# Patient Record
Sex: Male | Born: 1940 | Race: Black or African American | Hispanic: No | Smoking: Former smoker
Health system: Southern US, Community
[De-identification: ages and names within clinical notes are randomized; demographics above are authoritative.]

## PROBLEM LIST (undated history)

## (undated) DIAGNOSIS — I499 Cardiac arrhythmia, unspecified: Secondary | ICD-10-CM

## (undated) DIAGNOSIS — I10 Essential (primary) hypertension: Secondary | ICD-10-CM

## (undated) DIAGNOSIS — E119 Type 2 diabetes mellitus without complications: Secondary | ICD-10-CM

## (undated) DIAGNOSIS — C61 Malignant neoplasm of prostate: Secondary | ICD-10-CM

## (undated) HISTORY — DX: Type 2 diabetes mellitus without complications: E11.9

---

## 2002-01-08 ENCOUNTER — Emergency Department (HOSPITAL_COMMUNITY): Admission: EM | Admit: 2002-01-08 | Discharge: 2002-01-08 | Payer: Self-pay | Admitting: Emergency Medicine

## 2002-02-06 ENCOUNTER — Encounter: Payer: Self-pay | Admitting: Internal Medicine

## 2002-02-06 ENCOUNTER — Emergency Department (HOSPITAL_COMMUNITY): Admission: EM | Admit: 2002-02-06 | Discharge: 2002-02-06 | Payer: Self-pay | Admitting: Internal Medicine

## 2005-07-05 ENCOUNTER — Emergency Department (HOSPITAL_COMMUNITY): Admission: EM | Admit: 2005-07-05 | Discharge: 2005-07-05 | Payer: Self-pay | Admitting: Emergency Medicine

## 2005-12-10 ENCOUNTER — Ambulatory Visit (HOSPITAL_COMMUNITY): Admission: RE | Admit: 2005-12-10 | Discharge: 2005-12-10 | Payer: Self-pay | Admitting: Family Medicine

## 2005-12-10 ENCOUNTER — Inpatient Hospital Stay (HOSPITAL_COMMUNITY): Admission: EM | Admit: 2005-12-10 | Discharge: 2005-12-20 | Payer: Self-pay | Admitting: Emergency Medicine

## 2005-12-10 ENCOUNTER — Ambulatory Visit: Payer: Self-pay | Admitting: Emergency Medicine

## 2005-12-16 ENCOUNTER — Encounter: Payer: Self-pay | Admitting: *Deleted

## 2005-12-28 ENCOUNTER — Emergency Department (HOSPITAL_COMMUNITY): Admission: EM | Admit: 2005-12-28 | Discharge: 2005-12-28 | Payer: Self-pay | Admitting: Emergency Medicine

## 2005-12-29 ENCOUNTER — Emergency Department (HOSPITAL_COMMUNITY): Admission: EM | Admit: 2005-12-29 | Discharge: 2005-12-29 | Payer: Self-pay | Admitting: Emergency Medicine

## 2007-11-10 ENCOUNTER — Encounter (INDEPENDENT_AMBULATORY_CARE_PROVIDER_SITE_OTHER): Payer: Self-pay | Admitting: Urology

## 2007-11-12 ENCOUNTER — Inpatient Hospital Stay (HOSPITAL_COMMUNITY): Admission: EM | Admit: 2007-11-12 | Discharge: 2007-11-17 | Payer: Self-pay | Admitting: Emergency Medicine

## 2007-11-25 ENCOUNTER — Encounter (HOSPITAL_COMMUNITY): Admission: RE | Admit: 2007-11-25 | Discharge: 2007-12-25 | Payer: Self-pay | Admitting: Urology

## 2007-12-02 ENCOUNTER — Emergency Department (HOSPITAL_COMMUNITY): Admission: EM | Admit: 2007-12-02 | Discharge: 2007-12-02 | Payer: Self-pay | Admitting: Emergency Medicine

## 2007-12-08 ENCOUNTER — Ambulatory Visit (HOSPITAL_COMMUNITY): Admission: RE | Admit: 2007-12-08 | Discharge: 2007-12-08 | Payer: Self-pay | Admitting: Urology

## 2007-12-11 ENCOUNTER — Emergency Department (HOSPITAL_COMMUNITY): Admission: EM | Admit: 2007-12-11 | Discharge: 2007-12-11 | Payer: Self-pay | Admitting: Emergency Medicine

## 2008-02-08 ENCOUNTER — Emergency Department (HOSPITAL_COMMUNITY): Admission: EM | Admit: 2008-02-08 | Discharge: 2008-02-08 | Payer: Self-pay | Admitting: Emergency Medicine

## 2008-02-22 ENCOUNTER — Ambulatory Visit: Admission: RE | Admit: 2008-02-22 | Discharge: 2008-04-05 | Payer: Self-pay | Admitting: Radiation Oncology

## 2008-04-08 DIAGNOSIS — C61 Malignant neoplasm of prostate: Secondary | ICD-10-CM

## 2008-04-08 HISTORY — DX: Malignant neoplasm of prostate: C61

## 2008-04-14 ENCOUNTER — Ambulatory Visit: Admission: RE | Admit: 2008-04-14 | Discharge: 2008-05-19 | Payer: Self-pay | Admitting: Radiation Oncology

## 2009-03-06 ENCOUNTER — Emergency Department (HOSPITAL_COMMUNITY): Admission: EM | Admit: 2009-03-06 | Discharge: 2009-03-06 | Payer: Self-pay | Admitting: Emergency Medicine

## 2009-03-14 ENCOUNTER — Ambulatory Visit (HOSPITAL_COMMUNITY): Admission: RE | Admit: 2009-03-14 | Discharge: 2009-03-14 | Payer: Self-pay | Admitting: Urology

## 2009-03-18 ENCOUNTER — Emergency Department (HOSPITAL_COMMUNITY): Admission: EM | Admit: 2009-03-18 | Discharge: 2009-03-18 | Payer: Self-pay | Admitting: Emergency Medicine

## 2009-04-09 ENCOUNTER — Emergency Department (HOSPITAL_COMMUNITY): Admission: EM | Admit: 2009-04-09 | Discharge: 2009-04-09 | Payer: Self-pay | Admitting: Emergency Medicine

## 2010-06-22 ENCOUNTER — Emergency Department (HOSPITAL_COMMUNITY)
Admission: EM | Admit: 2010-06-22 | Discharge: 2010-06-22 | Disposition: A | Discharge status: Home / Self Care | Payer: Medicare (Managed Care) | Attending: Emergency Medicine | Admitting: Emergency Medicine

## 2010-06-22 DIAGNOSIS — Y92009 Unspecified place in unspecified non-institutional (private) residence as the place of occurrence of the external cause: Secondary | ICD-10-CM | POA: Insufficient documentation

## 2010-06-22 DIAGNOSIS — IMO0002 Reserved for concepts with insufficient information to code with codable children: Secondary | ICD-10-CM | POA: Insufficient documentation

## 2010-06-22 DIAGNOSIS — T1500XA Foreign body in cornea, unspecified eye, initial encounter: Secondary | ICD-10-CM | POA: Insufficient documentation

## 2010-06-22 DIAGNOSIS — Y93H2 Activity, gardening and landscaping: Secondary | ICD-10-CM | POA: Insufficient documentation

## 2010-06-24 LAB — CULTURE, ROUTINE-ABSCESS

## 2010-07-10 LAB — URINALYSIS, ROUTINE W REFLEX MICROSCOPIC
Ketones, ur: NEGATIVE mg/dL
Leukocytes, UA: NEGATIVE
Nitrite: NEGATIVE
Protein, ur: 100 mg/dL — AB
Specific Gravity, Urine: >1.03 — ABNORMAL HIGH (ref 1.005–1.030)
pH: 6.5 (ref 5.0–8.0)

## 2010-07-10 LAB — URINE CULTURE

## 2010-07-10 LAB — URINE MICROSCOPIC-ADD ON

## 2010-07-11 LAB — URINALYSIS, ROUTINE W REFLEX MICROSCOPIC
Leukocytes, UA: NEGATIVE
Nitrite: NEGATIVE
Protein, ur: >300 mg/dL — AB
Urobilinogen, UA: 0.2 mg/dL (ref 0.0–1.0)

## 2010-07-11 LAB — URINE MICROSCOPIC-ADD ON

## 2010-07-11 LAB — URINE CULTURE
Colony Count: NO GROWTH
Culture: NO GROWTH

## 2010-08-21 NOTE — Group Therapy Note (Signed)
NAME:  WILDON, CUEVAS NO.:  [id number]   MEDICAL RECORD NO.:  [id number]          PATIENT TYPE:  INP   LOCATION:  A303                          FACILITY:  [facility]   PHYSICIAN:  Skeet Latch, DO    DATE OF BIRTH:  May 06, 1940   DATE OF PROCEDURE:  11/16/2007  DATE OF DISCHARGE:                                 PROGRESS NOTE   SUBJECTIVE:  Mr. Vora continues to improve.  Has no major complaints at  this time.  The patient was complaining of headaches, states that they  have improved.  Overall, he is doing well.   OBJECTIVE:  VITAL SIGNS:  Temperature 97.4, pulse 75, respirations 24,  blood pressure 126/82, saturating 96% on 2 liters.  CARDIOVASCULAR:  Regular rate and rhythm.  No murmurs, rubs or gallops.  LUNGS:  Clear to auscultation bilaterally.  No rhonchi, rales or  wheezes.  ABDOMEN:  Obese, soft, nontender, nondistended.  Positive bowel sounds.  EXTREMITIES:  No cyanosis, clubbing or edema.   LABORATORY DATA:  White count 20.5, hemoglobin 11.8, hematocrit 36.1,  platelet count 157.  Sodium 136, potassium 3.7, chloride 101, CO2 28,  glucose 115, BUN 14, creatinine 0.0.   ASSESSMENT/PLAN:  1. Urosepsis.  Positive blood cultures for E. coli.  Continue with IV      antibiotics.  Continues to be on vancomycin and Zosyn.  Will      attempt to adjust if the psychosis has not really improved.  2. History of nonischemic cardiomyopathy.  Will continue beta blocker,      Xanax and spironolactone.  3. Elevated PSA, probable prostate cancer.  This is being followed by      Urology.  4. Probable obstructive sleep apnea on prior sleep study as an      outpatient.  5. Hypertension.  Stable.  Being treated.      Skeet Latch, DO  Electronically Signed     SM/MEDQ  D:  11/16/2007  T:  11/16/2007  Job:  [id number]

## 2010-08-21 NOTE — Discharge Summary (Signed)
[name], [name] NO.:  [id number]   MEDICAL RECORD NO.:  [id number]          PATIENT TYPE:  INP   LOCATION:  A303                          FACILITY:  [facility]   PHYSICIAN:  Skeet Latch, DO    DATE OF BIRTH:  1940/12/22   DATE OF ADMISSION:  11/12/2007  DATE OF DISCHARGE:  08/11/2009LH                               DISCHARGE SUMMARY   DISCHARGE DIAGNOSES:  1. Urosepsis.  2. Nonischemic cardiomyopathy.  3. Elevated prostate-specific antigen, probable prostate cancer.  4. Obstructive sleep apnea.  5. Hypertension.  6. History of congestive heart failure.  7. History of chronic obstructive pulmonary disease.   BRIEF HOSPITAL COURSE:  This is a 70 year old African American male who  presented to the ER with fever and chills.  The patient had recent  prostatic biopsy and was taking p.o. antibiotics.  The patient presented  stating he has had fever and chills for a few days and now stated he was  started on antibiotics for secondary to recent prostate biopsy.  The  patient states that he is taking his medications a day prior to be  admitted.  He states he stopped taking it because it made him feel  nauseated.  Upon being seen in the emergency room, blood work showed a  white count of 23,400, hemoglobin 13.7, and hematocrit 40.8.  He had a  BUN of 15, creatinine 1.22.  His urine showed positive nitrites and  positive for bacteria.  The patient was admitted for urosepsis, pyrexia,  leukocytosis, hyponatremia, hypokalemia, and tachycardia.  The patient  was started on IV antibiotics with Zosyn and vancomycin and Urology was  consulted.  Secondary to his tachycardia, Cardiology was consulted.  The  patient has a history of nonischemic cardiomyopathy with estimated  ejection fraction of 30%.  They agree with antibiotic treatment for his  COPD.  His spironolactone and digoxin was continued and Coreg was  started to his regimen.  I recommended an outpatient  echocardiogram as  well as an outpatient sleep study.  They recommended followup with them  in 1-2 weeks after discharge, and he continue his current medications.  Urology was also consulted.  His IV vancomycin and Zosyn was continued.  The patient's symptoms of fever and chills have improved.  For his  urosepsis, the patient was continued on IV antibiotics during his  hospital stay and his white count has improved.  The patient's initial  chest x-ray showed acute cardiopulmonary disease and mild cardiomegaly  without failure.  He had a CT of his head without contrast was done  secondary to headaches, showed chronic microvascular change of the  hemispheric white matter.  No sign of acute or reversible pathology.  No  specific cause of headache was identified.  At this time, we felt the  patient could be discharged home.   MEDICATIONS AT DISCHARGE:  1. Lanoxin 0.25 mg daily.  2. Aldactone 25 mg daily.  3. Clonidine 0.1 mg twice a day.  4. Lisinopril 10 mg daily.  5. Coreg 3.125 mg twice a day.  6. Augmentin 875 mg twice  a day for 10 days.   CONDITION ON DISCHARGE:  Stable.   DISPOSITION:  The patient will be discharged to home.   VITALS ON DISCHARGE:  Temperature is 97.8, pulse 72, respiration is 20,  blood pressure 131/71, and he is sating 92% on room air.   LABORATORY DATA:  White count is 11,000, hemoglobin 11.8, hematocrit  35.9, and platelet count is 202.   DISCHARGE INSTRUCTIONS:  The patient is to maintain low-salt, heart-  healthy diet.  He is to increase his activity slowly.  The patient is to  follow up with Dr. Regino Schultze in approximately 1 week.  He is to follow up  with Dr. Domingo Sep on November 26, 2007, at 2.30 p.m. at her office.  He is  to return if he has any major pain and/or call his primary care  physician.      Skeet Latch, DO  Electronically Signed     SM/MEDQ  D:  11/17/2007  T:  11/18/2007  Job:  [id number]   cc:   Kirk Ruths, M.D.  Fax:  [phone]

## 2010-08-21 NOTE — Consult Note (Signed)
NAME:  KEIN, CARLBERG                 ACCOUNT NO.:  [id number]   MEDICAL RECORD NO.:  [id number]          PATIENT TYPE:  INP   LOCATION:  A303                          FACILITY:  [facility]   PHYSICIAN:  Ky Barban, M.D.DATE OF BIRTH:  04/08/1941   DATE OF CONSULTATION:  DATE OF DISCHARGE:                                 CONSULTATION   CHIEF COMPLAINT:  High fever and chills.  Mr. Oyama a couple of days ago  had transurethral needle biopsy of the prostate by Dr. Rito Ehrlich.  The  patient started to have chills and fever, came to the emergency room  where he was found to have almost 104 temperature, having difficulty  voiding, and he was given IV vancomycin and Zosyn.  In the emergency  room, the patient was already taking Cipro for __________ problem.  Now  his temperature has started to come down.  The patient is recommended  further management.  This morning, he was feeling much better having no  fever, no voiding difficulty.   PHYSICAL EXAMINATION:  Morbidly obese not in any acute distress, fully  conscious, alert, oriented.  Abdomen, soft, flat.  Liver, spleen, and  kidneys are not palpable.  External genitalia, he is circumcised.  Meatus adequate.  Testicles are normal.  Rectal examination is deferred.  Extremities are normal.   IMPRESSION:  1. Urosepsis probably.  2. Elevated prostate-specific antigen.  3. History of congestive heart failure.   RECOMMENDATIONS:  His Cipro has been discontinued, continue IV  vancomycin and Zosyn.  I will follow.      Ky Barban, M.D.  Electronically Signed     MIJ/MEDQ  D:  11/13/2007  T:  11/13/2007  Job:  [id number]

## 2010-08-21 NOTE — H&P (Signed)
NAME:  [name], [name] NO.:  [id number]   MEDICAL RECORD NO.:  [id number]          PATIENT TYPE:  INP   LOCATION:  A303                          FACILITY:  [facility]   PHYSICIAN:  Dorris Singh, DO    DATE OF BIRTH:  Jul 26, 1940   DATE OF ADMISSION:  11/12/2007  DATE OF DISCHARGE:  LH                              HISTORY & PHYSICAL   ADDENDUM:  Of note, there has been an error in his reported medications  from the emergency room.  He is currently on:  1. Lanoxin 0.25 mg 1 by mouth daily.  2. Cipro 500 mg 1 by mouth twice a day x2 weeks, which he has not been      taking.  3. Aldactone 25 mg 1 by mouth daily.  4. Clonidine 0.1 mg 1 by mouth twice a day.  5. Lisinopril 10 mg 1 by mouth daily.      Dorris Singh, DO  Electronically Signed     CB/MEDQ  D:  11/13/2007  T:  11/13/2007  Job:  [id number]

## 2010-08-21 NOTE — Consult Note (Signed)
[name], [name]                 ACCOUNT NO.:  [id number]   MEDICAL RECORD NO.:  [id number]          PATIENT TYPE:  INP   LOCATION:  A303                          FACILITY:  [facility]   PHYSICIAN:  Dani Gobble, MD       DATE OF BIRTH:  1940/09/07   DATE OF CONSULTATION:  11/13/2007  DATE OF DISCHARGE:                                 CONSULTATION   REFERRING PHYSICIAN:  Dr. Dorris Singh, [facility].   PRIMARY CARE PHYSICIAN:  Kirk Ruths, MD   The consult is for nonischemic cardiomyopathy.   [name] is a very pleasant 70 year old gentleman with a past medical  history of COPD, hypertension, nonischemic cardiomyopathy with an  ejection fraction estimated at 25-35%, a cath in 2007.  He has an  elevated PSA followed by Urology and he is status post recent prostate  biopsy.  He has hyperglycemia and hypertension.  He has been seen by  myself in September 2007, when he was admitted with chest discomfort.  He ultimately was transferred to [facility] for cardiac catheterization,  which revealed normal coronary arteries with a moderately severe global  hypokinesis with focal hypocontractility of the mid-to-basal inferior  wall.  EF was estimated at 30-35% at that point in time.  He had a  normal aortoiliac system and there was no evidence of  underlying  stenosis.  He was found to have an elevated PSA of 18 at that point in  time, and he was seen by Dr. Patsi Sears.  He has just recently had a  prostate biopsy 2 days prior to admission and was on antibiotics.  Review of the previous records show that he was actually admitted in  2007 for dyspnea on exertion and shortness of breath.  He had diminished  oxygen saturations at that point in time.  Bone scan by Dr. Patsi Sears  was negative.  He did have a pulmonary consult at that point in time,  which found that he had restrictive lung disease and he was placed on  bronchodilators.  They also recommend an outpatient sleep study to  evaluate for obstructive sleep apnea, but it is not clear to me whether  or not he had this performed.   He was admitted for fever and chills and body aches.  He was nauseated  on the antibiotics, so he self-discontinued them.  Apparently, per the  H&P, he was admitted with very high fever with chills and felt weak all  over and had myalgias and arthralgias.   On my interview with him, he denies chest pain, chest pressure, or chest  tightness.  He reports that he becomes somewhat short-winded when he  said as best if he is walking up along hill, but he reports his  pulmonary status has not changed over the past 2 years.  He denies  palpitations but does admit to occasional mild lightheadedness without  presyncope or syncope or falls.  He denies lower extremity edema, PND,  or orthopnea.  He did not return for his followup office visit due to  financial burden.  He now has  Medicare and appears quite eager to return  for followup visits.  Today, he reports feeling much better as compared  to yesterday on admission.  He certainly looks fairly upbeat sitting on  the side of the bed and without any distress whatsoever.   He had an echo in September 2007, which revealed mild concentric LVH;  mild aortic sclerosis without stenosis; mild MR, PI, and TR; and  diminished ejection fraction also estimated 25-35% with akinetic  inferior wall and otherwise moderate global hypokinesis.  He did have  left ventricular dilatation.  He had marked right atrial enlargement,  moderate left atrial enlargement, and moderate right ventricular  dilatation with some mild-to-moderate decrease in right ventricular  systolic function.  There was a trivial posterior pericardial effusion  without evidence of compromise.   PAST MEDICAL HISTORY:  1. Systolic CHF with the last ejection fraction of approximately 30%.  2. COPD.  3. Hypertension.  4. History of an elevated PSA, status post prostate biopsy 2 days       prior to admission.  5. History of hyperglycemia.  6. Hypertension.  7. Probable obstructive sleep apnea, but it is not clear to me if he      has had a sleep study or not.   ALLERGIES:  None known.   His medical regimen prior to admission according to the H&P include:  1. Lanoxin 0.25 mg daily.  2. Ciprofloxacin 500 b.i.d. for 2 weeks, however, he self discontinued      this.  3. Aldactone 25 mg daily.  4. Clonidine 0.1 mg of b.i.d.  5. Lisinopril 10 mg daily.   SOCIAL HISTORY:  He quit smoking 2 years ago.  He denies alcohol or  illicit drug use.  He lives alone.  He has one son and one daughter.   Family history is notable for CAD and hypertension.   REVIEW OF SYSTEMS:  GENERAL:  As outlined above.  EYES:  Negative.  ENT:  Negative.  RESPIRATORY:  As outlined above.  CARDIOVASCULAR:  As  outlined above.  GI:  Negative for hematochezia or melena.  GU:  As  outlined above with recent prostate biopsy and elevated PSA, managed by  Dr. Patsi Sears in Urology.  MUSCULOSKELETAL:  Negative.  NEUROLOGIC:  Negative.  SKIN:  Negative.  ENDOCRINE:  Negative for diabetes.   Physical exam reveals a very pleasant, obese African American male in no  acute distress.  Alert and oriented x3.  Weight 268 pounds.  He was  admitted with a fever of 104.6 and he was afebrile at last check.  There  are no reported blood pressures on the electronic medical records  despite the fact it is 9 a.m. in the morning.  His last blood pressure  yesterday at midnight was 109/59 with a pulse of 67, respirations 20,  and again afebrile at that point in time.  This morning, he was 96% O2  sat on 2 liters, however, after he walked it dropped to 89%.  HEENT,  normocephalic and atraumatic.  Neck is negative for any obvious JVD;  however, body habitus precludes excluding this definitively.  I do not  appreciate bruits, lymphadenopathy, or thyromegaly.  The lungs reveal  distant breath sounds throughout possibly  slightly more distant at the  bases, however, not significantly, and I do not appreciate crackles,  wheezes, or rhonchi.  Cardiovascular reveals a tachycardic rate, a  regular rhythm.  Distant S1 and S2.  He does have an S3.  I do  not  palpate the PMI, but I do not appreciate a heave or lift.  The abdomen  is obese, soft, and seemed somewhat distended to me; however, the  patient reports this is usual for him.  No obvious masses or bruits are  detected.  The lower extremities are entirely negative for edema.  Distal pulses are intact and equal bilaterally.   Lab work from today, an INR of 1.1, a brain natriuretic peptide is  negative 81.6, sodium 136, potassium 3.7, chloride 98, bicarb 33,  glucose 169, BUN 14, creatinine 1.1, and calcium of 8.6.  White count  18, hematocrit 41.2, and platelets 189,000.  He does have a left shift  to 95%.  UA on admission showed moderate bilirubin, large blood,  spillage of protein greater than 300, nitrate positive, and trace  leukocytes.  Urine microscopic notable for elevated white cells and a  few bacteria.  Lactate normal at 1.7.  His white count has actually come  down somewhat from yesterday when it was 23.4.  Blood cultures are  pending.  Chest x-ray on admission revealed mild cardiomegaly without  failure and no acute cardiopulmonary disease.   EKG this a.m. shows mild sinus tachycardia of 108 beats per minute with  late transition and nonspecific ST changes.  Other than the heart rate  being faster, there is no significant change from a previous EKG in  2007.   IMPRESSION:  1. Hospital admission for acute onset of fever, chills, myalgias, and      arthralgias status post prostate biopsy and self discontinuation of      antibiotics prematurely along with an increased white count and a      left shift, all suggestive of infectious process.  2. Physical exam, chest x-ray, and brain natriuretic peptide, all      negative for congestive heart  failure.  3. Nonischemic cardiomyopathy in 2007, with an estimated ejection      fraction of 30% as well as right ventricular failure as well.  4. Elevated prostate-specific antigen in the midst of a workup with      Urology.  5. Chronic obstructive pulmonary disease.  6. Hypertension - well controlled at present.  7. Probable obstructive sleep apnea although it is not clear to me if      he has had a sleep study or not.  8. History of hyperglycemia.  9. Normal coronary arteries and cath in 2007.  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   RECOMMENDATIONS:  1. Agree with current antibiotic treatment as well as appropriate      treatment for a COPD per the primary team.  2. Continue spironolactone.  3. Continue digoxin.  4. We would check a digoxin level.  5. Agree with ACE inhibitor and clonidine if necessary for his blood      pressure control.  6. He does need a beta-blocker, which I will start low-dose Coreg and      I will titrate as an outpatient.  7. Need outpatient echocardiogram, which I will pursue after he is      over this acute illness.  8. We will schedule a sleep study as an outpatient as well.  9. We would check a fasting lipid profile while he is in the hospital.  10.Low-dose aspirin unless contraindicated with his prostate issues.  11.We will schedule a followup appointment with me in 1-2 weeks after      discharge to pursue the above noted recommendations,  but  currently he has no active cardiac issues and is quite well  compensated.  He will need a repeat echo to assess LV function and RV  function.  Of note, he is able to walk a half a mile or 20 minutes daily  without symptomatology or  difficulty.  1. He does report that he now has Medicare and he would definitely      return for a scheduled outpatient visit for further followup           ______________________________  Dani Gobble, MD     AB/MEDQ  D:  11/13/2007  T:  11/13/2007   Job:  [id number]   cc:   Kirk Ruths, M.D.  Fax: [phone]

## 2010-08-21 NOTE — H&P (Signed)
NAME:  [name], [name] NO.:  [id number]   MEDICAL RECORD NO.:  [id number]          PATIENT TYPE:  INP   LOCATION:  A303                          FACILITY:  [facility]   PHYSICIAN:  Dorris Singh, DO    DATE OF BIRTH:  [birth year]   DATE OF ADMISSION:  11/12/2007  DATE OF DISCHARGE:  LH                              HISTORY & PHYSICAL   __________   emergency room with fever and chills.  The patient had a prostatic  biopsy about 2 days ago and was to be taking antibiotics.  He took one  day of it and it made him sick so he stopped this.   PRIMARY CARE PHYSICIAN:  Kirk Ruths, M.D.   UROLOGIST:  Dennie Maizes, M.D.   When he presented he had very high fever with chills.  He felt weak all  over.  He denies any chest pain, shortness of breath, abdominal pain, or  any type of nausea, vomiting, or diarrhea.   PAST MEDICAL HISTORY:  Significant for:  1. CHF.  His CHF has an ejection fraction of 25-35%.  2. COPD.  3. Hypertension.  4. History of elevated PSA.  5. History of hyperglycemia.   PAST SURGICAL HISTORY:  Significant for recent prostatic biopsy.  No  major surgeries.   ALLERGIES:  No known drug allergies.   SOCIAL HISTORY:  He is a former smoker.  He used to be a heavy smoker in  the past, 2 years ago.  He denies any drug use.  He is an occasional  drinker.   MEDICATIONS:  1. Lanoxin 0.125 mg daily.  2. Avapro 300 mg once a day.  3. Aldactone 25 mg once a day.  4. Clonidine 0.2 mg three times a day.  5. Diltiazem 300 mg once a day.  6. Aspirin 81 mg once a day.  7. Combivent inhaler 2 puffs 4 times a day which he is not taking.  8. Albuterol inhaler as needed which he ran out of.   REVIEW OF SYSTEMS:  CONSTITUTIONAL:  Positive for weakness, fever, and  chills.  EYES:  No changes in vision.  EARS/NOSE/MOUTH/THROAT:  Negative  for sore throat, changes in smell or hearing.  CARDIOVASCULAR:  Negative  for chest pain.  RESPIRATORY:  Negative for  cough.  GASTROINTESTINAL:  Negative for nausea, vomiting, diarrhea, abdominal pain, or blood in the  stool.  GENITOURINARY:  Positive for dysuria and frequency.  MUSCULOSKELETAL:  Negative for back pain and neck pain.  SKIN:  Negative  for pruritus and rash.  NEUROLOGY:  Negative for headache and numbness.   PHYSICAL EXAMINATION:  VITAL SIGNS:  His actual weight is 268 pounds,  temperature 104.6, respirations 24, pulse rate 165, with a blood  pressure of 167/104.  GENERAL:  The patient is a 70 year old male who is well-developed, well-  nourished.  HEENT:  Head is normocephalic and atraumatic.  Eyes are PERRLA, EOMI.  There is no conjunctival injection or scleral icterus.  Mouth and  oropharynx, there is no exudate noted.  NECK:  Supple.  Full range of motion.  No lymphadenopathy.  CARDIOVASCULAR:  Tachycardic.  No murmurs noted.  LUNGS:  Clear to auscultation bilaterally.  No rales, wheezes, or  rhonchi.  ABDOMEN:  Soft, nontender, and nondistended.  No suprapubic tenderness.  Also no CVA tenderness.  NEUROLOGY:  Cranial nerves II-XII grossly intact.  Normal speech.  SKIN:  Good turgor and good texture.   TESTING:  None was done.  There is a chest x-ray which shows no acute  cardiopulmonary disease, mild cardiomegaly without failure.   LABORATORY DATA:  White count 23.4, hemoglobin 13.7, hematocrit 40.8,  platelet count 204.  Sodium 134, potassium 3.4, chloride 97, carbon  dioxide 26, glucose 183, BUN 15, creatinine 1.22.  Lactic acid within  normal limits at 0.7.  Urine; 32 bacteria, positive nitrites.   ASSESSMENT:  1. Urosepsis.  2. Pyrexia.  3. Leukocytosis.  4. Hypokalemia.  5. Hyponatremia.  6. Tachycardia.   PLAN:  Admit the patient to service of [facility].  He was started on  Zosyn and Vancomycin and we will continue with those while he is here.  Also will consult Ky Barban, M.D. since he is status post a  prostatic biopsy 2 days prior.  We will put him on  nonsteroidal anti-  inflammatory agents to control his pyrexia.  We will also give him  fluids cautiously due to his CHF.  We will start him back on his  medication. We will also continue to monitor his rate and rhythm as well  as since he has been off of his Cardizem, but we will start him back on  it.  We may need __________.  We will continue to monitor and follow him  and change therapy as necessary.      Dorris Singh, DO  Electronically Signed     CB/MEDQ  D:  11/12/2007  T:  11/13/2007  Job:  [id number]

## 2010-08-24 NOTE — Cardiovascular Report (Signed)
NAME:  Christopher, Maddox NO.:  [id number]   MEDICAL RECORD NO.:  [id number]          PATIENT TYPE:  INP   LOCATION:  3736                         FACILITY:  [facility]   PHYSICIAN:  Nicki Guadalajara, M.D.     DATE OF BIRTH:  02-24-1941   DATE OF PROCEDURE:  12/13/2005  DATE OF DISCHARGE:                              CARDIAC CATHETERIZATION   INDICATIONS:  [name] is a 71 year old African-American male with  history of morbid obesity and shortness of breath.  He has history of  hypertension, COPD.  A strong family history of coronary disease, and  probable obstructive sleep apnea.  He was seen by Dr. Domingo Sep at [facility] with chest pain and dyspnea.  He ultimately was transferred to  [facility] with plans for ultimate cardiac catheterization.   DESCRIPTION OF PROCEDURE:  After premedication with Valium 5 mg  intravenously, the patient was prepped and draped in usual fashion.  His  right femoral artery was punctured anteriorly and a 5-French sheath was  inserted.  Diagnostic catheterization was done, with 5-French Judkins left  and right coronary catheters.  A 5-French pigtail catheter was used for  biplane left ventriculography.  Distal aortography was also performed, in  this patient was significant hypertensive history to evaluate the potential  for renal vascular etiology.  Hemostasis was obtained by direct manual  pressure.  He tolerated the procedure well.   HEMODYNAMIC DATA:  Central aortic pressure was 140/92, mean 112, left  ventricle pressure was 140/13, post A-wave 19.   ANGIOGRAPHIC DATA:  1. Left main coronary artery was angiographically normal and trifurcated      into an LAD, a ramus intermediate vessel, and a left circumflex vessel.      The left main was angiographically normal.  2. The LAD was angiographically normal and gave rise to a proximal      bifurcating diagonal vessel.  Several small septal perforating      arteries,  and it wrapped around the LV apex.  3. The ramus intermediate vessel was angiographically normal.  4. The circumflex vessel was angiographically normal.  It gave rise to      marginal vessels and a posterolateral vessel.  5. The right coronary artery was a normal vessel that gave rise to a PDA      and inferior LV branch.  It was angiographically normal.  6. Biplane left ventriculography revealed a dilated left ventricle with      moderately severe global hypocontractility with an ejection fraction of      approximately 30-35%.  There appeared to be more focal      hypocontractility involving the mid-to-basal inferior wall in the RAO      projection, as well as involving the mid-to-upper posterolateral wall      on the LAO projection.   Distal aortography revealed a fairly normal aortoiliac system.  Specifically  there is no evidence for renal artery stenosis.   IMPRESSION:  1. Moderately severe global LV dysfunction with an ejection fraction of 30-      35%  with more pronounced hypocontractility involving the mid-to-basal      inferior and mid-to-upper posterolateral wall.  2. Normal coronary arteries.  3. Normal aortoiliac system.  4. Systemic hypertension.  5. 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           ______________________________  Nicki Guadalajara, M.D.     TK/MEDQ  D:  12/13/2005  T:  12/13/2005  Job:  [id number]   cc:   Dani Gobble, MD  Kirk Ruths, M.D.

## 2010-08-24 NOTE — Procedures (Signed)
NAMECLIF, SERIO NO.:  [id number]   MEDICAL RECORD NO.:  [id number]          PATIENT TYPE:  INP   LOCATION:  A204                          FACILITY:  [facility]   PHYSICIAN:  Dani Gobble, MD       DATE OF BIRTH:  06-18-1940   DATE OF PROCEDURE:  DATE OF DISCHARGE:                                  ECHOCARDIOGRAM   REFERRING PHYSICIAN:  Kirk Ruths, M.D.   INDICATION:  A 70 year old gentleman admitted with shortness of breath and a  past medical history of hypertension who is referred for evaluation of LV  function.   The technical quality of the study is somewhat limited, particularly from  the apical views due to the patient's body habitus and poor acoustic  windows.   The aorta measures normally at 2.9-cm.  The left atrium is moderately  dilated, measured 5.0-cm.  The patient did appear to be in sinus rhythm  during the procedure.   The interventricular septum and posterior wall are mild to mildly thickened  measured at 1.5-cm and 1.4-cm, respectively.   The aortic valve is trileaflet and mildly thickened but with normal leaflet  excursion.  No significant aortic insufficiency is noted.  Doppler  interrogation of the aortic valve is within normal limits.   The mitral valve appears structurally normal.  No mitral valve prolapse is  noted.  Mild mitral regurgitation is noted.  Doppler interrogation of the  mitral valve is within normal limits.   The pulmonic valve is not well visualized but appeared to be grossly  structurally normal with trace to mild pulmonic insufficiency.   The tricuspid valve appears grossly structurally normal.  Mild tricuspid  regurgitation is noted.   The left ventricle is borderline dilated with the LV IDD measured at 5.4-cm.  The LV ISD is measured 4.3-cm.  Overall left ventricular systolic function  is diminished with an estimated ejection fraction of 25-35%.  There is  moderate global hypokinesis except the inferior  wall appears akinetic.  Doppler of the inflow reveals a pseudo normal pattern.  There is an  increased E point septal separation consistent with left ventricular  dilatation.   The right atrium is moderate to markedly dilated.  The right ventricle is  moderately dilated with a mild to moderate decrease in right ventricular  ejection fraction.   There is a suggestion of a trivial posterior pericardial effusion but this  is not well visualized and there no evidence of hemodynamic compromise.   IMPRESSION:  1. Mild to moderate concentric left ventricular hypertrophy.  2. Moderate left atrial enlargement.  3. Mild aortic sclerosis without stenosis.  4. Mild mitral and tricuspid regurgitation.  5. Trace to mild pulmonic insufficiency.  6. Borderline left ventricular dilatation with diminished ejection      fraction estimated at 25-35%.  There is moderate global hypokinesis      except the inferior wall is akinetic.  There is a pseudo normal pattern      on the inflow signal.  7. Moderate to marked right atrial enlargement.  8. Moderate right  ventricular enlargement with moderate decrease in right      ventricular ejection fraction           ______________________________  Dani Gobble, MD     AB/MEDQ  D:  12/11/2005  T:  12/11/2005  Job:  [id number]   cc:   Kirk Ruths, M.D.  Fax: [phone]

## 2010-08-24 NOTE — Consult Note (Signed)
NAME:  [name], [name]                 ACCOUNT NO.:  [id number]   MEDICAL RECORD NO.:  [id number]          PATIENT TYPE:  INP   LOCATION:  3736                         FACILITY:  [facility]   PHYSICIAN:  Sigmund I. Patsi Sears, M.D.DATE OF BIRTH:  12-Aug-1940   DATE OF CONSULTATION:  12/13/2005  DATE OF DISCHARGE:                                   CONSULTATION   SUBJECTIVE:  This 70 year old divorced, retired [place] American male, who  just returned one week prior to his admission to [facility] from  [employer].  He is admitted to [facility] and then to  [facility] for evaluation of shortness of breath, where a  laboratory evaluation showed elevation of PSA to 18.  The patient denies  bladder outlet symptoms, but does note that he had gross hematuria one and a  half years ago x1 episode.  Because it cleared, he never did anything about  it.  He has no flank pain, no fever, no chills.  No history of kidney  stones.  There is no family history of stones or tumors.   REVIEW OF SYSTEMS:  Is negative, except for shortness of breath and  wheezing.  The patient denies difficulty voiding and has not had a catheter  this week.   PAST HISTORY:  1. COPD.  2. Hypertension.  3. Coronary artery disease.   SOCIAL HISTORY:  Tobacco history of 2 pack a day x47 years (now no tobacco  and having patch in place).  Alcohol history of one pint per week x40 years,  now with decreased amount of alcohol.   PHYSICAL EXAMINATION:  GENERAL:  Today shows an obese African American male  in no acute distress.  VITAL SIGNS:  His vital signs show a temperature of 98.4.  Blood pressure  118/84.  Pulse 81.  Respiratory rate 20.  O2 saturation 96%.  NECK:  Supple.  Nontender.  No nodes.  CHEST:  Occasional wheezes, COPD with distant breath sounds.  CORE:  S1, S2 normal without heaves, thrills or murmurs.  ABDOMEN:  Obese.  Positive bowel sounds without organomegaly or masses.  GENITOURINARY:  Shows normal penis.  Normal urethra.  Normal glands.  Testicles measure 4 x 4 cm.  They are nontender.  RECTAL EXAMINATION:  Shows normal sphincter tone.  Prostate 3+.  __________  benign.  No masses and no blood.  EXTREMITIES:  Show no cyanosis or edema.  PSYCHOLOGIC:  Shows normal orientation to time, person and place.   IMPRESSION:  Routine laboratory screening PSA of 18 could indicate prostate  cancer with metastatic disease, or could be spurious result.   PLAN:  We will recheck PSA.  If elevated, then patient will need bone scan  while in hospital.  Eventually will need to have prostate biopsy (as  outpatient).      Sigmund I. Patsi Sears, M.D.  Electronically Signed     SIT/MEDQ  D:  12/13/2005  T:  12/14/2005  Job:  [id number]   cc:   Gerlene Burdock A. Alanda Amass, M.D.

## 2010-08-24 NOTE — H&P (Signed)
[name], ROSENDAHL                 ACCOUNT NO.:  [id number]   MEDICAL RECORD NO.:  [id number]          PATIENT TYPE:  INP   LOCATION:  A204                          FACILITY:  [facility]   PHYSICIAN:  Kirk Ruths, M.D.DATE OF BIRTH:  1940-05-21   DATE OF ADMISSION:  12/10/2005  DATE OF DISCHARGE:  LH                                HISTORY & PHYSICAL   CHIEF COMPLAINT:  Shortness of breath.   HISTORY OF PRESENT ILLNESS:  This is a 70 year old gentleman who is morbidly  obese.  The patient was seen in the office originally today with shortness  of breath progressive over the last several months.  The patient had O2  saturations in the 80s before and after nebulizers in the ER.  The patient  is a heavy smoker and he has hypertension for which he stopped taking his  Cardizem years ago.  The patient's chest film showed some cardiomegaly with  atelectasis.  Labs were significant for an elevated BNP at 500.   ALLERGIES:  No known drug allergies.   MEDICATIONS:  None.   PAST MEDICAL HISTORY:  History of hypertension in the past, but has been  noncompliant for years.   SOCIAL HISTORY:  The patient is a heavy smoker, although he states he is  trying to quit.  He does drink alcohol occasionally.  He has a strong family  history for heart disease.   REVIEW OF SYSTEMS:  Denies chest pains, orthopnea or edema, gastroesophageal  reflux disease or GI complaints.   PHYSICAL EXAMINATION:  GENERAL:  He is a morbidly obese, pleasant male in no  severe distress.  VITAL SIGNS:  Afebrile, blood pressure 180/120, pulse 90 and regular,  respirations 24 and unlabored.  HEENT:  TMs normal.  Pupils equal round and reactive to light and  accommodation.  Oropharynx benign.  NECK:  Supple without JVD, bruit or thyromegaly.  LUNGS:  Essentially clear, although he does have prolonged expirations.  HEART:  Regular sinus rhythm without murmurs, rubs or gallops.  ABDOMEN:  Pendulous, soft, nontender.  EXTREMITIES:  Trace edema.  NEUROLOGIC:  Grossly intact.   ASSESSMENT:  1. Shortness of breath.  2. Probable chronic obstructive pulmonary disease.  3. History of hypertension.  4. History of cigarette use.      Kirk Ruths, M.D.  Electronically Signed     WMM/MEDQ  D:  12/10/2005  T:  12/10/2005  Job:  [id number]

## 2010-08-24 NOTE — Discharge Summary (Signed)
NAME:  [name], [name] NO.:  [id number]   MEDICAL RECORD NO.:  [id number]          PATIENT TYPE:  INP   LOCATION:  3736                         FACILITY:  [facility]   PHYSICIAN:  Darlin Priestly, MD  DATE OF BIRTH:  1940-05-13   DATE OF ADMISSION:  12/12/2005  DATE OF DISCHARGE:  12/20/2005                                 DISCHARGE SUMMARY   Mr.  Lightcap is a 70 year old male patient who was initially seen at [facility] by Dr. Ermalinda Memos on December 11, 2005.  He had been admitted  for dyspnea on exertion and shortness of breath.  He has been a heavy  smoker.  He has had hypertension but not been on his medications for some  time.  His O2 sats in the emergency room were 80%.  He was complaining about  chest pressure also, with dyspnea on exertion x1 month, was only able to  walk a couple of blocks at a time without having to stop to rest.  Dr.  Ermalinda Memos felt that he would need further cardiac evaluation, thus he was  transferred to [facility]  He was noted to have CHF.  He had lower  extremity edema.  His BNP was elevated.  It was decided that he should  undergo cardiac catheterization.  He was also put on Lovenox.  His cath was  done on December 15, 2005.  EF was 30%.  He had no coronary disease and he  had no renal artery stenosis.  He had global hypokinesis.   He also had a PSA done; it was very elevated.  Urology consult was called.  They decided to recheck the prostate level and if it was still elevated, to  get a bone scan.  As the level was so elevated, thus the bone scan was done.  It was negative.  They stated that he would eventually need an outpatient  biopsy.  The consult was done by Dr. Patsi Sears.  We continued to add  medications to try to control his blood pressure.  A pulmonary consult was  called.  He had PFTs done.  He has restrictive lung disease and he was put  on bronchodilators.  Pulmonary thought he would need an outpatient sleep  study to evaluate for obstructive sleep apnea.  He continued to have low O2  sats when he was up in our halls walking.  His blood pressure eventually  came down.  On December 19, 2005, it was 128/80.   He was seen by case management to help with financial problems and insurance  issues.  The patient stated he wanted to follow up with pulmonary in  [place] because of financial cost.  At the time of discharge, he was  changed to Combivent and albuterol.  He was also put on all generic  medications except for his ARB which there is no generic.  On December 20, 2005, he was considered stable for discharge home.  His blood pressure was  116/80.  Heart rate was 76.  Respirations were 18.  Temperature was 97.1.  LABORATORIES:  Sodium was 128, potassium 4.  Chloride was 91.  CO2 was 38.  Glucose was 138.  BUN was 23.  Creatinine was 1.  Hemoglobin was 15.9,  hematocrit 48.8.  WBCs were 12.7.  MCV was 94.9.  Platelets were 189.  BNP  was 587 at [facility], and then on December 16, 2005, it was 299.  PSA  initially was 17.69; the following day it was 16.56.  His urine was negative  for any infection.  He had a culture done. He had 20,000 colony counts of  multiple bacteria.  A chest x-ray showed stable cardiomegaly and small  effusions on December 12, 2005.  On December 16, 2005, he had a CT of his  abdomen with contrast.  He had no acute abdominal findings.  No abdominal  mass, lesions, or adenopathy.  He had bilateral pleural effusions.  No  significant bony abnormality, and no significant pulmonary, pelvic findings.  No masses.  Whole body scan was done on December 16, 2005.  He had no  definite evidence of metastatic disease.   DISCHARGE DIAGNOSES:  1. Hypertensive heart disease with global hypokinesis.  2. Non ischemic cardiomyopathy with an ejection fraction of 30% by      catheterization.  3. Chest pain, non ischemic related, status post cardiac catheterization      with normal  coronary arteries and also had normal renal arteries.  4. 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  Had      pulmonary function tests while in the hospital.  FVC was 37% predicted,      FEV1 was 35% predicted.  FE1/FVC actual was 66%; predicted was 78%.      His DLCO2 was 50%.  DL/VA was 161. The patient preferred to follow up      with a pulmonary physician in [place].  5. Elevated PSA.  Needs followup as an outpatient.  He will need      outpatient prostate biopsy.  He will follow up with Dr. Patsi Sears.  He      had a negative bone scan while in the hospital.  6. Questionable obstructive sleep apnea.  Will need outpatient sleep      study.  7. Hypertension, controlled on multiple medications.  8. Financial problems.  Currently not on Medicare.  He has retired.  His      social security and retirement cover his living expenses and he has      only a very small amount of money extra.  9. Four-chamber enlargement on echocardiogram with decreased right      ventricular and left ventricular function.      Lezlie Octave, N.P.      Darlin Priestly, MD  Electronically Signed    BB/MEDQ  D:  12/20/2005  T:  12/21/2005  Job:  [id number]   cc:   Pulmonary service  Dr. Timoteo Expose in [place]  Dr. Patsi Sears  Dr. Cline Cools, [place]

## 2010-08-24 NOTE — Discharge Summary (Signed)
[name], [name] NO.:  [id number]   MEDICAL RECORD NO.:  [id number]          PATIENT TYPE:  INP   LOCATION:  3736                         FACILITY:  [facility]   PHYSICIAN:  Kirk Ruths, M.D.DATE OF BIRTH:  1941-02-02   DATE OF ADMISSION:  12/10/2005  DATE OF DISCHARGE:  09/14/2007LH                                 DISCHARGE SUMMARY   FINAL DIAGNOSES:  1. Chronic obstructive pulmonary disease.  2. Hypertension.  3. Congestive heart failure.  4. Cardiomyopathy with ejection fraction of 25-35%.  5. Elevated prostate-specific antigen.  6. Hyperglycemia.   HOSPITAL COURSE:  This is a 70 year old male who was admitted through the  office with severe hypertension along with shortness of breath and O2  saturations in the 80s.  The patient had been noncompliant with his Cardizem  which was restarted.  The patient's blood pressure returned towards normal.  Initial white count was 6700 with hemoglobin of 14.9.  The patient's  electrolytes were within normal range.  He did have some elevated blood  sugars 203 on admission and 158 the following day.  Cardiac enzymes were  negative except persistently elevated troponin which would be consistent  with his elevated BNP of 500.  The patient's thyroid studies were normal.  He had a PSA elevated at 18.86.  The patient was seen by cardiology, Dr.  Domingo Sep, and after abnormal echocardiogram and CT scan suggest CHF the  patient was transferred to [facility] for cardiac catheterization and  further evaluation of his multiple medical problems.  Incidentally, his  lipids were within the normal range with total cholesterol being 171,  triglycerides 40, HDL 42, LDL 121.      Kirk Ruths, M.D.  Electronically Signed     WMM/MEDQ  D:  01/10/2006  T:  01/11/2006  Job:  [id number]

## 2011-01-04 LAB — DIFFERENTIAL
Basophils Absolute: 0
Basophils Relative: 0
Basophils Relative: 0
Basophils Relative: 0
Eosinophils Absolute: 0
Eosinophils Absolute: 0.1
Eosinophils Absolute: 0.2
Eosinophils Relative: 0
Eosinophils Relative: 0
Eosinophils Relative: 0
Lymphocytes Relative: 12
Lymphocytes Relative: 7 — ABNORMAL LOW
Lymphs Abs: 0.4 — ABNORMAL LOW
Lymphs Abs: 1.4
Lymphs Abs: 1.8
Monocytes Absolute: 0.5
Monocytes Absolute: 0.5
Monocytes Absolute: 1
Monocytes Absolute: 1.6 — ABNORMAL HIGH
Monocytes Relative: 10
Monocytes Relative: 11
Monocytes Relative: 3
Monocytes Relative: 9
Monocytes Relative: 9
Neutro Abs: 15.5 — ABNORMAL HIGH
Neutrophils Relative %: 74
Neutrophils Relative %: 77
Neutrophils Relative %: 95 — ABNORMAL HIGH
Neutrophils Relative %: 95 — ABNORMAL HIGH

## 2011-01-04 LAB — CBC
HCT: 36.5 — ABNORMAL LOW
HCT: 41.2
Hemoglobin: 11.8 — ABNORMAL LOW
Hemoglobin: 11.8 — ABNORMAL LOW
Hemoglobin: 12.1 — ABNORMAL LOW
Hemoglobin: 13.6
MCHC: 33
MCHC: 33
MCHC: 33.1
MCV: 89.1
MCV: 89.5
MCV: 90.2
MCV: 90.4
Platelets: 176
RBC: 3.97 — ABNORMAL LOW
RBC: 4 — ABNORMAL LOW
RBC: 4.03 — ABNORMAL LOW
RBC: 4.57
RBC: 4.58
RDW: 13.4
RDW: 13.5
WBC: 11.4 — ABNORMAL HIGH
WBC: 18 — ABNORMAL HIGH
WBC: 20.5 — ABNORMAL HIGH
WBC: 23.4 — ABNORMAL HIGH

## 2011-01-04 LAB — LIPID PANEL
LDL Cholesterol: 87
Total CHOL/HDL Ratio: 6.3
Triglycerides: 93
VLDL: 19

## 2011-01-04 LAB — BASIC METABOLIC PANEL
BUN: 16
CO2: 26
CO2: 26
CO2: 33 — ABNORMAL HIGH
Calcium: 8.5
Calcium: 8.5
Calcium: 9.1
Chloride: 101
Chloride: 97
Chloride: 98
Creatinine, Ser: 0.9
Creatinine, Ser: 1.04
Creatinine, Ser: 1.11
GFR calc Af Amer: 54 — ABNORMAL LOW
GFR calc Af Amer: >60
GFR calc Af Amer: >60
GFR calc non Af Amer: 45 — ABNORMAL LOW
GFR calc non Af Amer: >60
Glucose, Bld: 143 — ABNORMAL HIGH
Glucose, Bld: 169 — ABNORMAL HIGH
Potassium: 3.7
Potassium: 3.7
Potassium: 4.3
Sodium: 133 — ABNORMAL LOW
Sodium: 134 — ABNORMAL LOW
Sodium: 136
Sodium: 136

## 2011-01-04 LAB — URINE CULTURE

## 2011-01-04 LAB — VANCOMYCIN, TROUGH: Vancomycin Tr: 10

## 2011-01-04 LAB — URINE MICROSCOPIC-ADD ON

## 2011-01-04 LAB — URINALYSIS, ROUTINE W REFLEX MICROSCOPIC
Nitrite: POSITIVE — AB
Specific Gravity, Urine: >1.03 — ABNORMAL HIGH
Urobilinogen, UA: 1

## 2011-01-04 LAB — CULTURE, BLOOD (ROUTINE X 2)

## 2011-01-04 LAB — LACTIC ACID, PLASMA: Lactic Acid, Venous: 1.7

## 2011-01-04 LAB — DIGOXIN LEVEL: Digoxin Level: <0.2 — ABNORMAL LOW

## 2011-01-04 LAB — PROTIME-INR: INR: 1.1

## 2011-01-08 LAB — URINE CULTURE

## 2011-01-08 LAB — URINALYSIS, ROUTINE W REFLEX MICROSCOPIC
Bilirubin Urine: NEGATIVE
Protein, ur: NEGATIVE
Urobilinogen, UA: 0.2

## 2011-01-09 LAB — URINALYSIS, ROUTINE W REFLEX MICROSCOPIC
Bilirubin Urine: NEGATIVE
Nitrite: POSITIVE — AB
Urobilinogen, UA: 1
pH: 7

## 2011-01-09 LAB — BASIC METABOLIC PANEL
Chloride: 101
GFR calc non Af Amer: >60
Glucose, Bld: 129 — ABNORMAL HIGH
Potassium: 4.3
Sodium: 139

## 2011-01-09 LAB — CBC
HCT: 40.1
Hemoglobin: 13.4
MCV: 90.1
RDW: 13.8
WBC: 11.7 — ABNORMAL HIGH

## 2011-01-09 LAB — URINE MICROSCOPIC-ADD ON

## 2011-01-09 LAB — DIFFERENTIAL
Eosinophils Absolute: 0.3
Eosinophils Relative: 2
Lymphocytes Relative: 21
Lymphs Abs: 2.4
Monocytes Absolute: 0.1

## 2011-01-09 LAB — APTT: aPTT: 31

## 2011-01-09 LAB — URINE CULTURE

## 2011-06-11 ENCOUNTER — Emergency Department (HOSPITAL_COMMUNITY)
Admission: EM | Admit: 2011-06-11 | Discharge: 2011-06-11 | Disposition: A | Discharge status: Home / Self Care | Payer: Medicare HMO | Attending: Emergency Medicine | Admitting: Emergency Medicine

## 2011-06-11 ENCOUNTER — Encounter (HOSPITAL_COMMUNITY): Payer: Self-pay | Admitting: *Deleted

## 2011-06-11 DIAGNOSIS — Z87891 Personal history of nicotine dependence: Secondary | ICD-10-CM | POA: Insufficient documentation

## 2011-06-11 DIAGNOSIS — C61 Malignant neoplasm of prostate: Secondary | ICD-10-CM | POA: Insufficient documentation

## 2011-06-11 DIAGNOSIS — R350 Frequency of micturition: Secondary | ICD-10-CM | POA: Insufficient documentation

## 2011-06-11 DIAGNOSIS — I10 Essential (primary) hypertension: Secondary | ICD-10-CM | POA: Insufficient documentation

## 2011-06-11 HISTORY — DX: Malignant neoplasm of prostate: C61

## 2011-06-11 HISTORY — DX: Essential (primary) hypertension: I10

## 2011-06-11 LAB — URINALYSIS, ROUTINE W REFLEX MICROSCOPIC
Glucose, UA: NEGATIVE mg/dL
Leukocytes, UA: NEGATIVE
Nitrite: NEGATIVE
Specific Gravity, Urine: 1.02 (ref 1.005–1.030)
pH: 6 (ref 5.0–8.0)

## 2011-06-11 LAB — COMPREHENSIVE METABOLIC PANEL
ALT: 22 U/L (ref 0–53)
AST: 17 U/L (ref 0–37)
Albumin: 3.3 g/dL — ABNORMAL LOW (ref 3.5–5.2)
Alkaline Phosphatase: 104 U/L (ref 39–117)
Chloride: 100 mEq/L (ref 96–112)
Potassium: 4.7 mEq/L (ref 3.5–5.1)
Sodium: 139 mEq/L (ref 135–145)
Total Bilirubin: 0.2 mg/dL — ABNORMAL LOW (ref 0.3–1.2)

## 2011-06-11 LAB — CBC
MCH: 29.8 pg (ref 26.0–34.0)
MCHC: 33.5 g/dL (ref 30.0–36.0)
MCV: 88.9 fL (ref 78.0–100.0)
Platelets: 286 10*3/uL (ref 150–400)
RBC: 4.4 MIL/uL (ref 4.22–5.81)

## 2011-06-11 LAB — DIFFERENTIAL
Eosinophils Absolute: 0.4 10*3/uL (ref 0.0–0.7)
Eosinophils Relative: 4 % (ref 0–5)
Lymphs Abs: 3.3 10*3/uL (ref 0.7–4.0)

## 2011-06-11 NOTE — ED Provider Notes (Signed)
History  This chart was scribed for Benny Lennert, MD by Bennett Scrape. This patient was seen in room APA10/APA10 and the patient's care was started at 3:59PM.  CSN: [id number]  Arrival date & time 06/11/11  1331   First MD Initiated Contact with Patient 06/11/11 1556      Chief Complaint  Patient presents with  . Urinary Frequency   Patient is a 71 y.o. male presenting with frequency. The history is provided by the patient. No language interpreter was used.  Urinary Frequency This is a new problem. The current episode started more than 2 days ago. The problem occurs constantly. The problem has been gradually worsening. Pertinent negatives include no chest pain, no abdominal pain, no headaches and no shortness of breath. The symptoms are aggravated by nothing. The symptoms are relieved by nothing. He has tried nothing for the symptoms.     [name] is a 71 y.o. male who presents to the Emergency Department complaining of 3 to 4 days of gradual onset, gradually worsening, constant urinary frequency. Pt describes his urinary frequency symptoms as feeling the need to urinate more than usual with some occasional difficulty emptying his bladder. He denies any modifying factors. He denies trying any at home treatments to improve his symptoms. He reports that he has been staying hydrated and drinking plenty of fluids. He denies dysuria and fever as associated symptoms. He denies having a h/o diabetes. He denies having any other illnesses or injuries at this time. He has a h/o HTN and prostate cancer of which he is currently getting chemotherapy for. He is a former smoker but denies alcohol use.  Past Medical History  Diagnosis Date  . Hypertension   . Prostate cancer 2011    History reviewed. No pertinent past surgical history.  History reviewed. No pertinent family history.  History  Substance Use Topics  . Smoking status: Former Games developer  . Smokeless tobacco: Not on file  .  Alcohol Use: No      Review of Systems  Constitutional: Negative for fatigue.  HENT: Negative for congestion, sinus pressure and ear discharge.   Eyes: Negative for discharge.  Respiratory: Negative for cough and shortness of breath.   Cardiovascular: Negative for chest pain.  Gastrointestinal: Negative for abdominal pain and diarrhea.  Genitourinary: Positive for frequency and difficulty urinating. Negative for dysuria and hematuria.  Musculoskeletal: Negative for back pain.  Skin: Negative for rash.  Neurological: Negative for seizures and headaches.  Hematological: Negative.   Psychiatric/Behavioral: Negative for hallucinations.    Allergies  Review of patient's allergies indicates no known allergies.  Home Medications  No current outpatient prescriptions on file.  Triage Vitals: BP 120/56  Pulse 87  Temp(Src) 98.6 F (37 C) (Oral)  Resp 18  Ht 5\' 11"  (1.803 m)  Wt 240 lb (108.863 kg)  BMI 33.47 kg/m2  SpO2 98%  Physical Exam  Nursing note and vitals reviewed. Constitutional: He is oriented to person, place, and time. He appears well-developed and well-nourished.  HENT:  Head: Normocephalic and atraumatic.  Eyes: Conjunctivae and EOM are normal. No scleral icterus.  Neck: Neck supple. No thyromegaly present.  Cardiovascular: Normal rate and regular rhythm.  Exam reveals no gallop and no friction rub.   No murmur heard. Pulmonary/Chest: No stridor. He has no wheezes. He has no rales. He exhibits no tenderness.  Abdominal: He exhibits no distension. There is no tenderness. There is no rebound.  Musculoskeletal: Normal range of motion. He exhibits no  edema.  Lymphadenopathy:    He has no cervical adenopathy.  Neurological: He is alert and oriented to person, place, and time. Coordination normal.  Skin: No rash noted. No erythema.  Psychiatric: He has a normal mood and affect. His behavior is normal.    ED Course  Procedures (including critical care  time)  DIAGNOSTIC STUDIES: Oxygen Saturation is 98% on room air, normal by my interpretation.    COORDINATION OF CARE: 4:03PM-Discussed treatment plan with pt and pt agreed to plan.    Labs Reviewed  URINALYSIS, ROUTINE W REFLEX MICROSCOPIC   No results found.   No diagnosis found.    MDM  Urinary frequency.  Will have pt follow up with urologist     The chart was scribed for me under my direct supervision.  I personally performed the history, physical, and medical decision making and all procedures in the evaluation of this patient.Benny Lennert, MD 06/11/11 [phone]

## 2011-06-11 NOTE — ED Notes (Signed)
Stings when urinates.  No fever or chills

## 2011-06-11 NOTE — Discharge Instructions (Signed)
Follow up with dr. javaid in one week. °

## 2011-06-12 LAB — URINE CULTURE: Culture: NO GROWTH

## 2012-01-15 ENCOUNTER — Telehealth: Payer: Self-pay

## 2012-01-15 NOTE — Telephone Encounter (Signed)
Called pt. He has constipation. OV with Lorenza Burton, NP on 01/17/2012 @ 8:30 AM.

## 2012-01-17 ENCOUNTER — Ambulatory Visit (INDEPENDENT_AMBULATORY_CARE_PROVIDER_SITE_OTHER): Payer: Medicare HMO | Admitting: Urgent Care

## 2012-01-17 ENCOUNTER — Encounter: Payer: Self-pay | Admitting: Urgent Care

## 2012-01-17 VITALS — BP 127/75 | HR 87 | Temp 98.4°F | Ht 71.5 in | Wt 261.2 lb

## 2012-01-17 DIAGNOSIS — K5900 Constipation, unspecified: Secondary | ICD-10-CM | POA: Insufficient documentation

## 2012-01-17 DIAGNOSIS — Z1211 Encounter for screening for malignant neoplasm of colon: Secondary | ICD-10-CM | POA: Insufficient documentation

## 2012-01-17 MED ORDER — POLYETHYLENE GLYCOL 3350 17 GM/SCOOP PO POWD: 17.0000 g | Freq: Every day | ORAL | Status: DC

## 2012-01-17 MED ORDER — PEG 3350-KCL-NA BICARB-NACL 420 G PO SOLR: 4000.0000 mL | ORAL | Status: DC

## 2012-01-17 NOTE — Patient Instructions (Addendum)
Begin Miralax 17 grams daily for constipation Colonoscopy w/ Dr Darrick Penna Take half of your metformin the day prior to the procedure Hold diabetes medications day of procedure until after procedure Bring all your medications and/or any insulin to the hospital the day of the procedure. Follow blood sugars, call us or your PCP if any problems.   Constipation, Adult Constipation is when a person has fewer than 3 bowel movements a week; has difficulty having a bowel movement; or has stools that are dry, hard, or larger than normal. As people grow older, constipation is more common. If you try to fix constipation with medicines that make you have a bowel movement (laxatives), the problem may get worse. Long-term laxative use may cause the muscles of the colon to become weak. A low-fiber diet, not taking in enough fluids, and taking certain medicines may make constipation worse. CAUSES   Certain medicines, such as antidepressants, pain medicine, iron supplements, antacids, and water pills.   Certain diseases, such as diabetes, irritable bowel syndrome (IBS), thyroid disease, or depression.   Not drinking enough water.   Not eating enough fiber-rich foods.   Stress or travel.  Lack of physical activity or exercise.  Not going to the restroom when there is the urge to have a bowel movement.  Ignoring the urge to have a bowel movement.  Using laxatives too much. SYMPTOMS   Having fewer than 3 bowel movements a week.   Straining to have a bowel movement.   Having hard, dry, or larger than normal stools.   Feeling full or bloated.   Pain in the lower abdomen.  Not feeling relief after having a bowel movement. DIAGNOSIS  Your caregiver will take a medical history and perform a physical exam. Further testing may be done for severe constipation. Some tests may include:   A barium enema X-ray to examine your rectum, colon, and sometimes, your small intestine.  A sigmoidoscopy to  examine your lower colon.  A colonoscopy to examine your entire colon. TREATMENT  Treatment will depend on the severity of your constipation and what is causing it. Some dietary treatments include drinking more fluids and eating more fiber-rich foods. Lifestyle treatments may include regular exercise. If these diet and lifestyle recommendations do not help, your caregiver may recommend taking over-the-counter laxative medicines to help you have bowel movements. Prescription medicines may be prescribed if over-the-counter medicines do not work.  HOME CARE INSTRUCTIONS   Increase dietary fiber in your diet, such as fruits, vegetables, whole grains, and beans. Limit high-fat and processed sugars in your diet, such as [place] fries, hamburgers, cookies, candies, and soda.   A fiber supplement may be added to your diet if you cannot get enough fiber from foods.   Drink enough fluids to keep your urine clear or pale yellow.   Exercise regularly or as directed by your caregiver.   Go to the restroom when you have the urge to go. Do not hold it.  Only take medicines as directed by your caregiver. Do not take other medicines for constipation without talking to your caregiver first. SEEK IMMEDIATE MEDICAL CARE IF:   You have bright red blood in your stool.   Your constipation lasts for more than 4 days or gets worse.   You have abdominal or rectal pain.   You have thin, pencil-like stools.  You have unexplained weight loss. MAKE SURE YOU:   Understand these instructions.  Will watch your condition.  Will get help right away if  you are not doing well or get worse. Document Released: 12/22/2003 Document Revised: 06/17/2011 Document Reviewed: 02/26/2011 [facility] Patient Information 2013 [place], [place].

## 2012-01-17 NOTE — Progress Notes (Signed)
Faxed to PCP

## 2012-01-17 NOTE — Assessment & Plan Note (Addendum)
[name] is a pleasant 71 y.o. male with new-onset constipation over past 6 weeks.  Colonoscopy in near future w/ Dr Darrick Penna.  I have discussed risks & benefits which include, but are not limited to, bleeding, infection, perforation & drug reaction.  The patient agrees with this plan & written consent will be obtained.    Begin Miralax 17 grams daily for constipation Take half of metformin the day prior to the procedure Hold diabetes medications day of procedure until after procedure Bring all your medications and/or any insulin to the hospital the day of the procedure. Follow blood sugars, call us or your PCP if any problems. Constipation literature

## 2012-01-17 NOTE — Progress Notes (Signed)
Referring Provider: Kirk Ruths, MD Primary Care Physician:  Kirk Ruths, MD Primary Gastroenterologist:  Dr. Jonette Eva  Chief Complaint  Patient presents with  . Colonoscopy  . Constipation    HPI:  [name] is a 71 y.o. male here as a referral from Dr. Regino Schultze for screening colonoscopy.  Upon further triage, her mentioned having problems with constipation & was asked to come in for an office visit to discuss further prior to procedure.  Reports 4 month history of constipation.  He will go several days without a BM unless he takes Ex-lax.  He has been taking BID ExLax for past 6 weeks.  Denies diarrhea, rectal bleeding, melena or weight loss.  Denies abdominal pain.   Denies heartburn, indigestion, nausea, vomiting, dysphagia, odynophagia or anorexia.  Past Medical History  Diagnosis Date  . Hypertension   . Prostate cancer 2010  . Diabetes     No past surgical history on file.  Current Outpatient Prescriptions  Medication Sig Dispense Refill  . aspirin EC 81 MG tablet Take 81 mg by mouth daily.      . carvedilol (COREG) 6.25 MG tablet Take 6.25 mg by mouth 2 (two) times daily.      . cloNIDine (CATAPRES) 0.1 MG tablet Take 0.1 mg by mouth 2 (two) times daily.      . digoxin (LANOXIN) 0.25 MG tablet Take 250 mcg by mouth every morning.      Marland Kitchen lisinopril (PRINIVIL,ZESTRIL) 10 MG tablet Take 10 mg by mouth every morning.      . metFORMIN (GLUCOPHAGE) 1000 MG tablet Take 1,000 mg by mouth 2 (two) times daily.       Marland Kitchen spironolactone (ALDACTONE) 25 MG tablet Take 25 mg by mouth every morning.      . polyethylene glycol powder (MIRALAX) powder Take 17 g by mouth daily.  527 g  11    Allergies as of 01/17/2012  . (No Known Allergies)    Family History:There is no known family history of colorectal carcinoma , liver disease, or inflammatory bowel disease.  Problem Relation Age of Onset  . Prostate cancer Father   . Coronary artery disease Mother     History    Social History  . Marital Status: Divorced    Spouse Name: N/A    Number of Children: 2  . Years of Education: N/A   Occupational History  . retired, Designer, fashion/clothing    Social History Main Topics  . Smoking status: Former Smoker -- 0.5 packs/day    Types: Cigarettes    Quit date: 04/08/2005  . Smokeless tobacco: Former Neurosurgeon    Quit date: 04/18/2005  . Alcohol Use: No  . Drug Use: No  . Sexually Active: Not on file   Other Topics Concern  . Not on file   Social History Narrative   Lives alone    Review of Systems: Gen: Denies any fever, chills, sweats, anorexia, fatigue, weakness, malaise, weight loss, and sleep disorder CV: Denies chest pain, angina, palpitations, syncope, orthopnea, PND, peripheral edema, and claudication. Resp: Denies dyspnea at rest, dyspnea with exercise, cough, sputum, wheezing, coughing up blood, and pleurisy. GI: Denies vomiting blood, jaundice, and fecal incontinence.   Denies dysphagia or odynophagia. GU : Denies urinary burning, blood in urine, urinary frequency, urinary hesitancy, nocturnal urination, and urinary incontinence. MS: Denies joint pain, limitation of movement, and swelling, stiffness, low back pain, extremity pain. Denies muscle weakness, cramps, atrophy.  Derm: Denies rash, itching, dry skin, hives,  moles, warts, or unhealing ulcers.  Psych: Denies depression, anxiety, memory loss, suicidal ideation, hallucinations, paranoia, and confusion. Heme: Denies bruising, bleeding, and enlarged lymph nodes. Neuro:  Denies any headaches, dizziness, paresthesias. Endo:  Denies any problems with thyroid or adrenal function.  Physical Exam: BP 127/75  Pulse 87  Temp 98.4 F (36.9 C) (Temporal)  Ht 5' 11.5" (1.816 m)  Wt 261 lb 3.2 oz (118.48 kg)  BMI 35.92 kg/m2 No LMP for male patient. General:   Alert,  Well-developed, obese, pleasant and cooperative in NAD Head:  Normocephalic and atraumatic. Eyes:  Sclera clear, no icterus.   Conjunctiva  pink. Ears:  Normal auditory acuity. Nose:  No deformity, discharge, or lesions. Mouth:  Poor dentition.  No deformity or lesions,oropharynx pink & moist. Neck:  Supple; no masses or thyromegaly. Lungs:  Clear throughout to auscultation.   No wheezes, crackles, or rhonchi. No acute distress. Heart:  Regular rate and rhythm; no murmurs, clicks, rubs,  or gallops. Abdomen:  Protuberant.  Normal bowel sounds.  No bruits.  Soft, non-tender and non-distended without masses, hepatosplenomegaly or hernias noted.  No guarding or rebound tenderness.  Exam limited given patient's body habitus. Rectal:  Deferred. Msk:  Symmetrical without gross deformities. Normal posture. Pulses:  Normal pulses noted. Extremities:  No edema. Neurologic:  Alert and oriented x4;  grossly normal neurologically. Skin:  Intact without significant lesions or rashes. Lymph Nodes:  No significant cervical adenopathy. Psych:  Alert and cooperative. Normal mood and affect.

## 2012-01-30 ENCOUNTER — Encounter (HOSPITAL_COMMUNITY): Payer: Self-pay | Admitting: Pharmacy Technician

## 2012-02-06 MED ORDER — SODIUM CHLORIDE 0.45 % IV SOLN
INTRAVENOUS | Status: DC
  Administered 2012-02-07: 09:00:00 via INTRAVENOUS

## 2012-02-07 ENCOUNTER — Encounter (HOSPITAL_COMMUNITY): Payer: Self-pay | Admitting: *Deleted

## 2012-02-07 ENCOUNTER — Ambulatory Visit (HOSPITAL_COMMUNITY)
Admission: RE | Admit: 2012-02-07 | Discharge: 2012-02-07 | Disposition: A | Discharge status: Home / Self Care | Payer: Medicare HMO | Source: Ambulatory Visit | Attending: Gastroenterology | Admitting: Gastroenterology

## 2012-02-07 ENCOUNTER — Encounter (HOSPITAL_COMMUNITY)
Admission: RE | Disposition: A | Discharge status: Home / Self Care | Payer: Self-pay | Source: Ambulatory Visit | Attending: Gastroenterology

## 2012-02-07 DIAGNOSIS — I10 Essential (primary) hypertension: Secondary | ICD-10-CM | POA: Insufficient documentation

## 2012-02-07 DIAGNOSIS — K5730 Diverticulosis of large intestine without perforation or abscess without bleeding: Secondary | ICD-10-CM | POA: Insufficient documentation

## 2012-02-07 DIAGNOSIS — K648 Other hemorrhoids: Secondary | ICD-10-CM

## 2012-02-07 DIAGNOSIS — D126 Benign neoplasm of colon, unspecified: Secondary | ICD-10-CM

## 2012-02-07 DIAGNOSIS — Z1211 Encounter for screening for malignant neoplasm of colon: Secondary | ICD-10-CM | POA: Insufficient documentation

## 2012-02-07 DIAGNOSIS — Z01812 Encounter for preprocedural laboratory examination: Secondary | ICD-10-CM | POA: Insufficient documentation

## 2012-02-07 DIAGNOSIS — E119 Type 2 diabetes mellitus without complications: Secondary | ICD-10-CM | POA: Insufficient documentation

## 2012-02-07 HISTORY — PX: COLONOSCOPY: SHX5424

## 2012-02-07 HISTORY — DX: Cardiac arrhythmia, unspecified: I49.9

## 2012-02-07 SURGERY — COLONOSCOPY
Anesthesia: Moderate Sedation

## 2012-02-07 MED ORDER — STERILE WATER FOR IRRIGATION IR SOLN
Status: DC | PRN
  Administered 2012-02-07: 10:00:00

## 2012-02-07 MED ORDER — MEPERIDINE HCL 100 MG/ML IJ SOLN
INTRAMUSCULAR | Status: DC | PRN
  Administered 2012-02-07 (×3): 25 mg via INTRAVENOUS

## 2012-02-07 MED ORDER — MEPERIDINE HCL 100 MG/ML IJ SOLN
INTRAMUSCULAR | Status: AC
  Filled 2012-02-07: qty 1

## 2012-02-07 MED ORDER — EPINEPHRINE HCL 1 MG/ML IJ SOLN
INTRAMUSCULAR | Status: DC | PRN
  Administered 2012-02-07: .3 mg via INTRAVENOUS

## 2012-02-07 MED ORDER — SPOT INK MARKER SYRINGE KIT
PACK | SUBMUCOSAL | Status: DC | PRN
  Administered 2012-02-07: 3 mL via SUBMUCOSAL

## 2012-02-07 MED ORDER — MIDAZOLAM HCL 5 MG/5ML IJ SOLN
INTRAMUSCULAR | Status: AC
  Filled 2012-02-07: qty 5

## 2012-02-07 MED ORDER — MIDAZOLAM HCL 5 MG/5ML IJ SOLN
INTRAMUSCULAR | Status: AC
  Filled 2012-02-07: qty 10

## 2012-02-07 MED ORDER — MIDAZOLAM HCL 5 MG/5ML IJ SOLN
INTRAMUSCULAR | Status: DC | PRN
  Administered 2012-02-07: 2 mg via INTRAVENOUS
  Administered 2012-02-07: 1 mg via INTRAVENOUS
  Administered 2012-02-07: 2 mg via INTRAVENOUS
  Administered 2012-02-07 (×2): 1 mg via INTRAVENOUS

## 2012-02-07 NOTE — Op Note (Signed)
[facility] [address] [place], [address]   COLONOSCOPY PROCEDURE REPORT  PATIENT: [name], [name]  MR#: [id number] BIRTHDATE: 31-Oct-1940 , 71  yrs. old GENDER: Male ENDOSCOPIST: Jonette Eva, MD REFERRED [id number] Regino Schultze, M.D. PROCEDURE DATE:  02/07/2012 PROCEDURE:   Colonoscopy with biopsy, Colonoscopy with snare polypectomy, and Submucosal injection, any substance INDICATIONS:average risk patient for colon cancer. MEDICATIONS: Demerol 75 mg IV and Versed 7 mg IV  DESCRIPTION OF PROCEDURE:    Physical exam was performed.  Informed consent was obtained from the patient after explaining the benefits, risks, and alternatives to procedure.  The patient was connected to monitor and placed in left lateral position. Continuous oxygen was provided by nasal cannula and IV medicine administered through an indwelling cannula.  After administration of sedation and rectal exam, the patients rectum was intubated and the EG-[id number] ([id number]) and Pentax Colonoscope [id number]  colonoscope was advanced under direct visualization to the cecum.  The scope was removed slowly by carefully examining the color, texture, anatomy, and integrity mucosa on the way out.  The patient was recovered in endoscopy and discharged home in satisfactory condition.       COLON FINDINGS: Two sessile polyps measuring 3-8 mm in size were found at the hepatic flexure and in the descending colon.  A polypectomy was performed with cold forceps and using snare cautery and 2 CC OF EPINEPHRINE WAS INJECTED AT BASE OF POLYP. A polypoid shaped sessile polyp measuring 2-3 cm in size with a friable surface was found in the sigmoid colon.  A polypectomy was performed with a cold snare and using snare cautery.    Injection (tattooing) was performed(3 CC SPOT).  THE POLYP WAS RESECTED PIEACEMEAL. INITIAL SNARE WAS ATTEMPTED AND SNARE WOULD NOT RESECT THE POLYP. THE SNARE WAS CAUGHT INTHE POLYP AND COULD NOT  BE REMOVED. THE PT'S RECTUM WAS INTUBATED WITH THE DIAGNOSTIC GASTROSCOPE AND PIECEMEAL RESECTION OF THE POLYP WAS PERFORMED. THE INITIAL SNARE WAS RMOVED FROM TEH POLYP. THE COLONOSCOPE WS REMOVED. ADDITIONAL PASSES OF THE GASTROSCOPE WERE NECESSARY TO REMOVE THE POLYP FRAGMENTS VIA ROTH NET. CAUTERY WAS APPLIED TO THE SURFACE OF THE REMAINING POLYP. HEMOSTATSIS WAS ACHEIVED. The resection was incomplete and the polyp tissue was completely retrieved. MILD DIVERTICULOSUIS IN LEFT COLON. NO INTERNAL HEMORRHOIDS.  PREP QUALITY: good. CECAL W/D TIME: 88 minutes  COMPLICATIONS: None  ENDOSCOPIC IMPRESSION: 2 SMALL COLON POLYPS 1 LARGE COLON POLYP LIKELY ADVANCED OR CANCER   RECOMMENDATIONS: AWAIT BIOPSY HIGH FIBER DIET STOP ASPIRIN TCS IN [facility] FOR COMPLETE POLYP RESECTION IF PT HAS ADVANCED POLYP.       _______________________________ Rosalie DoctorJonette Eva, MD 02/07/2012 3:05 PM     PATIENT NAME:  [name] MR#: [id number]

## 2012-02-07 NOTE — H&P (Signed)
  Primary Care Physician:  Kirk Ruths, MD Primary Gastroenterologist:  Dr. Darrick Penna  Pre-Procedure History & Physical: HPI:  [name] is a 71 y.o. male here for COLON CANCER SCREENING.   Past Medical History  Diagnosis Date  . Hypertension   . Diabetes   . Prostate cancer 2010  . Irregular heart beat     History reviewed. No pertinent past surgical history.  Prior to Admission medications   Medication Sig Start Date End Date Taking? Authorizing Provider  aspirin EC 81 MG tablet Take 81 mg by mouth daily.   Yes Historical Provider, MD  carvedilol (COREG) 6.25 MG tablet Take 6.25 mg by mouth 2 (two) times daily.   Yes Historical Provider, MD  cloNIDine (CATAPRES) 0.1 MG tablet Take 0.1 mg by mouth 2 (two) times daily.   Yes Historical Provider, MD  digoxin (LANOXIN) 0.25 MG tablet Take 250 mcg by mouth every morning.   Yes Historical Provider, MD  lisinopril (PRINIVIL,ZESTRIL) 10 MG tablet Take 10 mg by mouth every morning.   Yes Historical Provider, MD  metFORMIN (GLUCOPHAGE) 1000 MG tablet Take 1,000 mg by mouth 2 (two) times daily.    Yes Historical Provider, MD  polyethylene glycol powder (MIRALAX) powder Take 17 g by mouth daily. 01/17/12  Yes Joselyn Arrow, NP  spironolactone (ALDACTONE) 25 MG tablet Take 25 mg by mouth every morning.   Yes Historical Provider, MD    Allergies as of 01/17/2012  . (No Known Allergies)    Family History  Problem Relation Age of Onset  . Prostate cancer Father   . Coronary artery disease Mother     History   Social History  . Marital Status: Divorced    Spouse Name: N/A    Number of Children: 2  . Years of Education: N/A   Occupational History  . retired, Designer, fashion/clothing    Social History Main Topics  . Smoking status: Former Smoker -- 0.5 packs/day    Types: Cigarettes    Quit date: 04/08/2005  . Smokeless tobacco: Former Neurosurgeon    Quit date: 04/18/2005  . Alcohol Use: No  . Drug Use: No  . Sexually Active: Not on file     Other Topics Concern  . Not on file   Social History Narrative   Lives alone    Review of Systems: See HPI, otherwise negative ROS   Physical Exam: BP 172/90  Pulse 79  Temp 97.8 F (36.6 C) (Oral)  Resp 21  SpO2 96% General:   Alert,  pleasant and cooperative in NAD Head:  Normocephalic and atraumatic. Neck:  Supple; Lungs:  Clear throughout to auscultation.    Heart:  Regular rate and rhythm. Abdomen:  Soft, nontender and nondistended. Normal bowel sounds, without guarding, and without rebound.   Neurologic:  Alert and  oriented x4;  grossly normal neurologically.  Impression/Plan:     SCREENING  Plan:  1. TCS TODAY

## 2012-02-07 NOTE — Discharge Instructions (Signed)
You had 2 small polyps AND ONE LARGE POLYP PARTIALLY removed. You have diverticulosis IN YOUR LEFT COLON.   STOP TAKING YOUR ASPIRIN.  FOLLOW A HIGH FIBER DIET. AVOID ITEMS THAT CAUSE BLOATING. SEE INFO BELOW.  YOUR BIOPSY RESULTS SHOULD BE BACK IN 3-5 DAYS.     Colonoscopy Care After Read the instructions outlined below and refer to this sheet in the next week. These discharge instructions provide you with general information on caring for yourself after you leave the hospital. While your treatment has been planned according to the most current medical practices available, unavoidable complications occasionally occur. If you have any problems or questions after discharge, call DR. Rodman Recupero, [phone].  ACTIVITY  You may resume your regular activity, but move at a slower pace for the next 24 hours.   Take frequent rest periods for the next 24 hours.   Walking will help get rid of the air and reduce the bloated feeling in your belly (abdomen).   No driving for 24 hours (because of the medicine (anesthesia) used during the test).   You may shower.   Do not sign any important legal documents or operate any machinery for 24 hours (because of the anesthesia used during the test).    NUTRITION  Drink plenty of fluids.   You may resume your normal diet as instructed by your doctor.   Begin with a light meal and progress to your normal diet. Heavy or fried foods are harder to digest and may make you feel sick to your stomach (nauseated).   Avoid alcoholic beverages for 24 hours or as instructed.    MEDICATIONS  You may resume your normal medications.   WHAT YOU CAN EXPECT TODAY  Some feelings of bloating in the abdomen.   Passage of more gas than usual.   Spotting of blood in your stool or on the toilet paper  .  IF YOU HAD POLYPS REMOVED DURING THE COLONOSCOPY:  Eat a soft diet IF YOU HAVE NAUSEA, BLOATING, ABDOMINAL PAIN, OR VOMITING.    FINDING OUT THE RESULTS  OF YOUR TEST Not all test results are available during your visit. DR. Darrick Penna WILL CALL YOU WITHIN 7 DAYS OF YOUR PROCEDUE WITH YOUR RESULTS. Do not assume everything is normal if you have not heard from DR. Kimmerly Lora IN ONE WEEK, CALL HER OFFICE AT [phone].  SEEK IMMEDIATE MEDICAL ATTENTION AND CALL THE OFFICE: [phone] IF:  You have more than a spotting of blood in your stool.   Your belly is swollen (abdominal distention).   You are nauseated or vomiting.   You have a temperature over 101F.   You have abdominal pain or discomfort that is severe or gets worse throughout the day.  Polyps, Colon  A polyp is extra tissue that grows inside your body. Colon polyps grow in the large intestine. The large intestine, also called the colon, is part of your digestive system. It is a long, hollow tube at the end of your digestive tract where your body makes and stores stool. Most polyps are not dangerous. They are benign. This means they are not cancerous. But over time, some types of polyps can turn into cancer. Polyps that are smaller than a pea are usually not harmful. But larger polyps could someday become or may already be cancerous. To be safe, doctors remove all polyps and test them.   WHO GETS POLYPS? Anyone can get polyps, but certain people are more likely than others. You may have a greater  chance of getting polyps if:  You are over 50.   You have had polyps before.   Someone in your family has had polyps.   Someone in your family has had cancer of the large intestine.   Find out if someone in your family has had polyps. You may also be more likely to get polyps if you:   Eat a lot of fatty foods   Smoke   Drink alcohol   Do not exercise  Eat too much   TREATMENT  The caregiver will remove the polyp during sigmoidoscopy or colonoscopy.  PREVENTION There is not one sure way to prevent polyps. You might be able to lower your risk of getting them if you:  Eat more  fruits and vegetables and less fatty food.   Do not smoke.   Avoid alcohol.   Exercise every day.   Lose weight if you are overweight.   Eating more calcium and folate can also lower your risk of getting polyps. Some foods that are rich in calcium are milk, cheese, and broccoli. Some foods that are rich in folate are chickpeas, kidney beans, and spinach.   High-Fiber Diet A high-fiber diet changes your normal diet to include more whole grains, legumes, fruits, and vegetables. Changes in the diet involve replacing refined carbohydrates with unrefined foods. The calorie level of the diet is essentially unchanged. The Dietary Reference Intake (recommended amount) for adult males is 38 grams per day. For adult females, it is 25 grams per day. Pregnant and lactating women should consume 28 grams of fiber per day. Fiber is the intact part of a plant that is not broken down during digestion. Functional fiber is fiber that has been isolated from the plant to provide a beneficial effect in the body. PURPOSE  Increase stool bulk.   Ease and regulate bowel movements.   Lower cholesterol.  INDICATIONS THAT YOU NEED MORE FIBER  Constipation and hemorrhoids.   Uncomplicated diverticulosis (intestine condition) and irritable bowel syndrome.   Weight management.   As a protective measure against hardening of the arteries (atherosclerosis), diabetes, and cancer.   GUIDELINES FOR INCREASING FIBER IN THE DIET  Start adding fiber to the diet slowly. A gradual increase of about 5 more grams (2 slices of whole-wheat bread, 2 servings of most fruits or vegetables, or 1 bowl of high-fiber cereal) per day is best. Too rapid an increase in fiber may result in constipation, flatulence, and bloating.   Drink enough water and fluids to keep your urine clear or pale yellow. Water, juice, or caffeine-free drinks are recommended. Not drinking enough fluid may cause constipation.   Eat a variety of high-fiber  foods rather than one type of fiber.   Try to increase your intake of fiber through using high-fiber foods rather than fiber pills or supplements that contain small amounts of fiber.   The goal is to change the types of food eaten. Do not supplement your present diet with high-fiber foods, but replace foods in your present diet.  INCLUDE A VARIETY OF FIBER SOURCES  Replace refined and processed grains with whole grains, canned fruits with fresh fruits, and incorporate other fiber sources. White rice, white breads, and most bakery goods contain little or no fiber.   Burress whole-grain rice, buckwheat oats, and many fruits and vegetables are all good sources of fiber. These include: broccoli, Brussels sprouts, cabbage, cauliflower, beets, sweet potatoes, white potatoes (skin on), carrots, tomatoes, eggplant, squash, berries, fresh fruits, and dried fruits.  Cereals appear to be the richest source of fiber. Cereal fiber is found in whole grains and bran. Bran is the fiber-rich outer coat of cereal grain, which is largely removed in refining. In whole-grain cereals, the bran remains. In breakfast cereals, the largest amount of fiber is found in those with "bran" in their names. The fiber content is sometimes indicated on the label.   You may need to include additional fruits and vegetables each day.   In baking, for 1 cup white flour, you may use the following substitutions:   1 cup whole-wheat flour minus 2 tablespoons.   1/2 cup white flour plus 1/2 cup whole-wheat flour.   Diverticulosis Diverticulosis is a common condition that develops when small pouches (diverticula) form in the wall of the colon. The risk of diverticulosis increases with age. It happens more often in people who eat a low-fiber diet. Most individuals with diverticulosis have no symptoms. Those individuals with symptoms usually experience belly (abdominal) pain, constipation, or loose stools (diarrhea).  HOME CARE  INSTRUCTIONS  Increase the amount of fiber in your diet as directed by your caregiver or dietician. This may reduce symptoms of diverticulosis.   Drink at least 6 to 8 glasses of water each day to prevent constipation.   Try not to strain when you have a bowel movement.   Avoiding nuts and seeds to prevent complications is still an uncertain benefit.       FOODS HAVING HIGH FIBER CONTENT INCLUDE:  Fruits. Apple, peach, pear, tangerine, raisins, prunes.   Vegetables. Brussels sprouts, asparagus, broccoli, cabbage, carrot, cauliflower, romaine lettuce, spinach, summer squash, tomato, winter squash, zucchini.   Starchy Vegetables. Baked beans, kidney beans, lima beans, split peas, lentils, potatoes (with skin).   Grains. Whole wheat bread, Card rice, bran flake cereal, plain oatmeal, white rice, shredded wheat, bran muffins.    SEEK IMMEDIATE MEDICAL CARE IF:  You develop increasing pain or severe bloating.   You have an oral temperature above 101F.   You develop vomiting or bowel movements that are bloody or black.   Hemorrhoids Hemorrhoids are dilated (enlarged) veins around the rectum. Sometimes clots will form in the veins. This makes them swollen and painful. These are called thrombosed hemorrhoids. Causes of hemorrhoids include:  Constipation.   Straining to have a bowel movement.   HEAVY LIFTING HOME CARE INSTRUCTIONS  Eat a well balanced diet and drink 6 to 8 glasses of water every day to avoid constipation. You may also use a bulk laxative.   Avoid straining to have bowel movements.   Keep anal area dry and clean.   Do not use a donut shaped pillow or sit on the toilet for long periods. This increases blood pooling and pain.   Move your bowels when your body has the urge; this will require less straining and will decrease pain and pressure.

## 2012-02-11 ENCOUNTER — Telehealth: Payer: Self-pay | Admitting: Gastroenterology

## 2012-02-11 ENCOUNTER — Encounter (HOSPITAL_COMMUNITY): Payer: Self-pay | Admitting: Gastroenterology

## 2012-02-11 NOTE — Telephone Encounter (Signed)
Called patient TO DISCUSS RESULTS. NO VOICE MAILBOX.  CALLED PT'S DAUGHTER. LVM TO CALL TO DISCUSS RESULTS.   HE HAD advanced adenoma with dysplasia PARTIALLY REMOVED. FLEX SIG TO COMPLETE REMOVAL.

## 2012-02-15 MED ORDER — HYDROCORTISONE ACETATE 25 MG RE SUPP: 25.0000 mg | Freq: Two times a day (BID) | RECTAL | Status: DC

## 2012-02-15 NOTE — Telephone Encounter (Addendum)
PT HAVING PROBLEMS WITH RECTAL PAIN. SENT RX FOR ANUSOL HC SUPP. PT ELECTED TO HAVE REMAINING POLYP REMOVED NOV 15. HE WOULD LIKE TO GIVE THE SUPP TIME TO TAKE EFFECT. NEEDS FLEX SIG TO REMOVE SIGMOID COLON POLYP. PT WILL CALL [facility] Monday TO SCHEDULE.

## 2012-02-15 NOTE — Addendum Note (Signed)
Addended by: [place] on: 02/15/2012 01:27 PM   Modules accepted: Orders

## 2012-02-15 NOTE — Progress Notes (Signed)
REVIEWED.  NOV 2013 ADVANCED POLYP

## 2012-02-17 ENCOUNTER — Encounter (HOSPITAL_COMMUNITY): Payer: Self-pay | Admitting: Pharmacy Technician

## 2012-02-17 ENCOUNTER — Other Ambulatory Visit: Payer: Self-pay | Admitting: Gastroenterology

## 2012-02-17 NOTE — Telephone Encounter (Signed)
Results faxed to PCP 

## 2012-02-17 NOTE — Telephone Encounter (Signed)
Patient is scheduled for Flex Sig on Friday Nov 22 w/SLF and he is aware and I have mailed instructions

## 2012-02-28 ENCOUNTER — Ambulatory Visit (HOSPITAL_COMMUNITY)
Admission: RE | Admit: 2012-02-28 | Discharge: 2012-02-28 | Disposition: A | Discharge status: Home / Self Care | Payer: Medicare HMO | Source: Ambulatory Visit | Attending: Gastroenterology | Admitting: Gastroenterology

## 2012-02-28 ENCOUNTER — Encounter (HOSPITAL_COMMUNITY): Payer: Self-pay

## 2012-02-28 ENCOUNTER — Encounter (HOSPITAL_COMMUNITY)
Admission: RE | Disposition: A | Discharge status: Home / Self Care | Payer: Self-pay | Source: Ambulatory Visit | Attending: Gastroenterology

## 2012-02-28 DIAGNOSIS — I10 Essential (primary) hypertension: Secondary | ICD-10-CM | POA: Insufficient documentation

## 2012-02-28 DIAGNOSIS — D126 Benign neoplasm of colon, unspecified: Secondary | ICD-10-CM | POA: Insufficient documentation

## 2012-02-28 DIAGNOSIS — K648 Other hemorrhoids: Secondary | ICD-10-CM

## 2012-02-28 DIAGNOSIS — E119 Type 2 diabetes mellitus without complications: Secondary | ICD-10-CM | POA: Insufficient documentation

## 2012-02-28 HISTORY — PX: FLEXIBLE SIGMOIDOSCOPY: SHX5431

## 2012-02-28 LAB — GLUCOSE, CAPILLARY: Glucose-Capillary: 99 mg/dL (ref 70–99)

## 2012-02-28 SURGERY — SIGMOIDOSCOPY, FLEXIBLE
Anesthesia: Moderate Sedation

## 2012-02-28 MED ORDER — MIDAZOLAM HCL 5 MG/5ML IJ SOLN
INTRAMUSCULAR | Status: AC
  Filled 2012-02-28: qty 10

## 2012-02-28 MED ORDER — STERILE WATER FOR IRRIGATION IR SOLN
Status: DC | PRN
  Administered 2012-02-28: 12:00:00

## 2012-02-28 MED ORDER — SODIUM CHLORIDE 0.45 % IV SOLN
INTRAVENOUS | Status: DC
  Administered 2012-02-28: 12:00:00 via INTRAVENOUS

## 2012-02-28 MED ORDER — MIDAZOLAM HCL 5 MG/5ML IJ SOLN
INTRAMUSCULAR | Status: DC | PRN
  Administered 2012-02-28 (×2): 2 mg via INTRAVENOUS

## 2012-02-28 MED ORDER — MEPERIDINE HCL 100 MG/ML IJ SOLN
INTRAMUSCULAR | Status: AC
  Filled 2012-02-28: qty 1

## 2012-02-28 MED ORDER — MEPERIDINE HCL 100 MG/ML IJ SOLN
INTRAMUSCULAR | Status: DC | PRN
  Administered 2012-02-28 (×2): 25 mg via INTRAVENOUS

## 2012-02-28 NOTE — H&P (View-Only) (Signed)
  Primary Care Physician:  MCGOUGH,WILLIAM M, MD Primary Gastroenterologist:  Dr. Allee Busk  Pre-Procedure History & Physical: HPI:  [name] is a 71 y.o. male here for COLON CANCER SCREENING.   Past Medical History  Diagnosis Date  . Hypertension   . Diabetes   . Prostate cancer 2010  . Irregular heart beat     History reviewed. No pertinent past surgical history.  Prior to Admission medications   Medication Sig Start Date End Date Taking? Authorizing Provider  aspirin EC 81 MG tablet Take 81 mg by mouth daily.   Yes Historical Provider, MD  carvedilol (COREG) 6.25 MG tablet Take 6.25 mg by mouth 2 (two) times daily.   Yes Historical Provider, MD  cloNIDine (CATAPRES) 0.1 MG tablet Take 0.1 mg by mouth 2 (two) times daily.   Yes Historical Provider, MD  digoxin (LANOXIN) 0.25 MG tablet Take 250 mcg by mouth every morning.   Yes Historical Provider, MD  lisinopril (PRINIVIL,ZESTRIL) 10 MG tablet Take 10 mg by mouth every morning.   Yes Historical Provider, MD  metFORMIN (GLUCOPHAGE) 1000 MG tablet Take 1,000 mg by mouth 2 (two) times daily.    Yes Historical Provider, MD  polyethylene glycol powder (MIRALAX) powder Take 17 g by mouth daily. 01/17/12  Yes Kandice L Jones, NP  spironolactone (ALDACTONE) 25 MG tablet Take 25 mg by mouth every morning.   Yes Historical Provider, MD    Allergies as of 01/17/2012  . (No Known Allergies)    Family History  Problem Relation Age of Onset  . Prostate cancer Father   . Coronary artery disease Mother     History   Social History  . Marital Status: Divorced    Spouse Name: N/A    Number of Children: 2  . Years of Education: N/A   Occupational History  . retired, textiles    Social History Main Topics  . Smoking status: Former Smoker -- 0.5 packs/day    Types: Cigarettes    Quit date: 04/08/2005  . Smokeless tobacco: Former User    Quit date: 04/18/2005  . Alcohol Use: No  . Drug Use: No  . Sexually Active: Not on file     Other Topics Concern  . Not on file   Social History Narrative   Lives alone    Review of Systems: See HPI, otherwise negative ROS   Physical Exam: BP 172/90  Pulse 79  Temp 97.8 F (36.6 C) (Oral)  Resp 21  SpO2 96% General:   Alert,  pleasant and cooperative in NAD Head:  Normocephalic and atraumatic. Neck:  Supple; Lungs:  Clear throughout to auscultation.    Heart:  Regular rate and rhythm. Abdomen:  Soft, nontender and nondistended. Normal bowel sounds, without guarding, and without rebound.   Neurologic:  Alert and  oriented x4;  grossly normal neurologically.  Impression/Plan:     SCREENING  Plan:  1. TCS TODAY  

## 2012-02-28 NOTE — Op Note (Addendum)
[facility] [address] [place], [address]   FLEX SIGMOIDOSCOPY PROCEDURE REPORT  PATIENT: [name], [name]  MR#: [id number] BIRTHDATE: 04-Aug-1940 , 71  yrs. old GENDER: Male ENDOSCOPIST: Jonette Eva, MD REFERRED [id number] Regino Schultze, M.D. PROCEDURE DATE:  02/28/2012 PROCEDURE:   Sigmoidoscopy with snare  WITH RESOLUTION CLIP PLACEMENTx2 INDICATIONS:Sigmoid polyp: TV W.  HGD partially removed NOV 1. MEDICATIONS: Demerol 50 mg IV and Versed 4 mg IV  DESCRIPTION OF PROCEDURE:    Physical exam was performed.  Informed consent was obtained from the patient after explaining the benefits, risks, and alternatives to procedure.  The patient was connected to monitor and placed in left lateral position. Continuous oxygen was provided by nasal cannula and IV medicine administered through an indwelling cannula.  After administration of sedation and rectal exam, the patients rectum was intubated and the EC-3890Li ([id number])  colonoscope was advanced under direct visualization to the cecum.  The scope was removed slowly by carefully examining the color, texture, anatomy, and integrity mucosa on the way out.  The patient was recovered in endoscopy and discharged home in satisfactory condition.       COLON FINDINGS: Small internal hemorrhoids were found and A polypoid shaped sessile polyp measuring 1-2 cm in size was found in the sigmoid colon.  A polypectomy was performed using snare cautery. 2 OUT OF 4 CLIPS SUCCESSFULLY DEPLOYED TO PREVENT POST-POLYPECTOMY BLEED.  PREP QUALITY: good   COMPLICATIONS: None  ENDOSCOPIC IMPRESSION: Small internal hemorrhoids SIGMOID COLON POLYP, REMOVED   RECOMMENDATIONS: AWAIT BIOPSY HIGH FIBER DIET NO MRI FOR 30 DAYS. FLEX SIG IN 1 YEAR       _______________________________ eSignedJonette Eva, MD 02/28/2012 1:34 PM Revised: 02/28/2012 1:34 PM    PATIENT NAME:  [name] MR#: [id number]

## 2012-02-28 NOTE — Interval H&P Note (Signed)
History and Physical Interval Note:  02/28/2012 12:12 PM  [name]  has presented today for surgery, with the diagnosis of Remove a sigmoid colon polyp  The various methods of treatment have been discussed with the patient and family. After consideration of risks, benefits and other options for treatment, the patient has consented to  Procedure(s) (LRB) with comments: FLEXIBLE SIGMOIDOSCOPY (N/A) - 12:45 as a surgical intervention .  The patient's history has been reviewed, patient examined, no change in status, stable for surgery.  I have reviewed the patient's chart and labs.  Questions were answered to the patient's satisfaction.     [employer]

## 2012-03-03 ENCOUNTER — Encounter (HOSPITAL_COMMUNITY): Payer: Self-pay | Admitting: Gastroenterology

## 2012-03-04 NOTE — Telephone Encounter (Signed)
Called and informed pt.  

## 2012-03-04 NOTE — Telephone Encounter (Signed)
PLEASE CALL PT.  HIS POLYP HAS BEEN COMPLETELY REMOVED. REPEAT FSIG IN ONE YEAR. RESTART ASA NOV 30. NO MRI UNTIL DEC 23. FOLLOW A HIGH FIBER DIET. ALL FIRST DEGREE RELATIVES NEED TCS STARTING AT AGE 69.

## 2012-03-04 NOTE — Telephone Encounter (Signed)
Path faxed to PCP, recall made 

## 2012-09-22 ENCOUNTER — Ambulatory Visit: Payer: Medicare HMO | Admitting: Internal Medicine

## 2012-10-02 ENCOUNTER — Encounter: Payer: Self-pay | Admitting: Cardiovascular Disease

## 2012-10-02 ENCOUNTER — Ambulatory Visit (INDEPENDENT_AMBULATORY_CARE_PROVIDER_SITE_OTHER): Payer: Medicare HMO | Admitting: Cardiovascular Disease

## 2012-10-02 VITALS — BP 142/64 | HR 74 | Ht 71.0 in | Wt 244.0 lb

## 2012-10-02 DIAGNOSIS — I10 Essential (primary) hypertension: Secondary | ICD-10-CM

## 2012-10-02 DIAGNOSIS — E119 Type 2 diabetes mellitus without complications: Secondary | ICD-10-CM

## 2012-10-02 NOTE — Patient Instructions (Addendum)
Your physician recommends that you schedule a follow-up appointment as needed  

## 2012-10-02 NOTE — Assessment & Plan Note (Signed)
Under acceptable control on current medications

## 2012-10-02 NOTE — Progress Notes (Signed)
10/02/2012 [name]   06-24-1940  [id number]  Primary Physician Christopher Ruths, MD Primary Cardiologist: Christopher Gess MD [name]   HPI:  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 His Percocet profile positive for 25-pack-year history of tobacco use having quit 7 years ago. History of hypertension, diabetes. His mother died of an MI at age 60. He's never had a heart attack or stroke, and denies heart is of breath or chest pain. His as referred  for cardiac evaluation.   Current Outpatient Prescriptions  Medication Sig Dispense Refill  . carvedilol (COREG) 6.25 MG tablet Take 6.25 mg by mouth 2 (two) times daily.      . cloNIDine (CATAPRES) 0.1 MG tablet Take 0.1 mg by mouth 2 (two) times daily.      . digoxin (LANOXIN) 0.25 MG tablet Take 250 mcg by mouth every morning.      . hydrocortisone (ANUSOL-HC) 25 MG suppository Place 1 suppository (25 mg total) rectally every 12 (twelve) hours. For 12 days  24 suppository  0  . lisinopril (PRINIVIL,ZESTRIL) 10 MG tablet Take 10 mg by mouth every morning.      . metFORMIN (GLUCOPHAGE) 1000 MG tablet Take 1,000 mg by mouth 2 (two) times daily.       [name] Kitchen spironolactone (ALDACTONE) 25 MG tablet Take 25 mg by mouth every morning.       No current facility-administered medications for this visit.    No Known Allergies  History   Social History  . Marital Status: Divorced    Spouse Name: N/A    Number of Children: 2  . Years of Education: N/A   Occupational History  . retired, Designer, fashion/clothing    Social History Main Topics  . Smoking status: Former Smoker -- 0.50 packs/day    Types: Cigarettes    Quit date: 04/08/2005  . Smokeless tobacco: Former Neurosurgeon    Quit date: 04/18/2005  . Alcohol Use: No  . Drug Use: No  . Sexually Active: Not on file   Other Topics Concern  .  Not on file   Social History Narrative   Lives alone     Review of Systems: General: negative for chills, fever, night sweats or weight changes.  Cardiovascular: negative for chest pain, dyspnea on exertion, edema, orthopnea, palpitations, paroxysmal nocturnal dyspnea or shortness of breath Dermatological: negative for rash Respiratory: negative for cough or wheezing Urologic: negative for hematuria Abdominal: negative for nausea, vomiting, diarrhea, bright red blood per rectum, melena, or hematemesis Neurologic: negative for visual changes, syncope, or dizziness All other systems reviewed and are otherwise negative except as noted above.    Blood pressure 142/64, pulse 74, height 5\' 11"  (1.803 m), weight 244 lb (110.678 kg).  General appearance: alert and no distress Neck: no adenopathy, no carotid bruit, no JVD, supple, symmetrical, trachea midline and thyroid not enlarged, symmetric, no tenderness/mass/nodules Lungs: clear to auscultation bilaterally Heart: regular rate and rhythm, S1, S2 normal, no murmur, click, rub or gallop Abdomen: soft, non-tender; bowel sounds normal; no masses,  no organomegaly Extremities: extremities normal, atraumatic, no cyanosis or edema Pulses: 2+ and symmetric  EKG normal sinus rhythm at 74 without ST or T wave changes  ASSESSMENT AND PLAN:   Essential hypertension Under acceptable control on current medications      Christopher Gess MD [facility] 10/02/2012 12:13 PM

## 2013-02-02 ENCOUNTER — Encounter (HOSPITAL_COMMUNITY): Payer: Self-pay | Admitting: Emergency Medicine

## 2013-02-02 ENCOUNTER — Emergency Department (HOSPITAL_COMMUNITY): Payer: Medicare HMO

## 2013-02-02 ENCOUNTER — Emergency Department (HOSPITAL_COMMUNITY)
Admission: EM | Admit: 2013-02-02 | Discharge: 2013-02-02 | Disposition: A | Discharge status: Home / Self Care | Payer: Medicare HMO | Attending: Emergency Medicine | Admitting: Emergency Medicine

## 2013-02-02 DIAGNOSIS — R0602 Shortness of breath: Secondary | ICD-10-CM | POA: Insufficient documentation

## 2013-02-02 DIAGNOSIS — C61 Malignant neoplasm of prostate: Secondary | ICD-10-CM | POA: Insufficient documentation

## 2013-02-02 DIAGNOSIS — R110 Nausea: Secondary | ICD-10-CM | POA: Insufficient documentation

## 2013-02-02 DIAGNOSIS — Z79899 Other long term (current) drug therapy: Secondary | ICD-10-CM | POA: Insufficient documentation

## 2013-02-02 DIAGNOSIS — I10 Essential (primary) hypertension: Secondary | ICD-10-CM | POA: Insufficient documentation

## 2013-02-02 DIAGNOSIS — H5789 Other specified disorders of eye and adnexa: Secondary | ICD-10-CM | POA: Insufficient documentation

## 2013-02-02 DIAGNOSIS — R61 Generalized hyperhidrosis: Secondary | ICD-10-CM | POA: Insufficient documentation

## 2013-02-02 DIAGNOSIS — E119 Type 2 diabetes mellitus without complications: Secondary | ICD-10-CM | POA: Insufficient documentation

## 2013-02-02 DIAGNOSIS — M545 Low back pain, unspecified: Secondary | ICD-10-CM | POA: Insufficient documentation

## 2013-02-02 DIAGNOSIS — Z8679 Personal history of other diseases of the circulatory system: Secondary | ICD-10-CM | POA: Insufficient documentation

## 2013-02-02 DIAGNOSIS — Z87891 Personal history of nicotine dependence: Secondary | ICD-10-CM | POA: Insufficient documentation

## 2013-02-02 DIAGNOSIS — M549 Dorsalgia, unspecified: Secondary | ICD-10-CM

## 2013-02-02 LAB — BASIC METABOLIC PANEL
BUN: 14 mg/dL (ref 6–23)
CO2: 32 mEq/L (ref 19–32)
Chloride: 101 mEq/L (ref 96–112)
Glucose, Bld: 100 mg/dL — ABNORMAL HIGH (ref 70–99)
Potassium: 4.8 mEq/L (ref 3.5–5.1)
Sodium: 139 mEq/L (ref 135–145)

## 2013-02-02 LAB — URINALYSIS, ROUTINE W REFLEX MICROSCOPIC
Bilirubin Urine: NEGATIVE
Glucose, UA: NEGATIVE mg/dL
Hgb urine dipstick: NEGATIVE
Specific Gravity, Urine: 1.015 (ref 1.005–1.030)
Urobilinogen, UA: 0.2 mg/dL (ref 0.0–1.0)

## 2013-02-02 LAB — CBC WITH DIFFERENTIAL/PLATELET
Hemoglobin: 13.1 g/dL (ref 13.0–17.0)
Lymphocytes Relative: 32 % (ref 12–46)
Lymphs Abs: 2.8 10*3/uL (ref 0.7–4.0)
MCH: 29.9 pg (ref 26.0–34.0)
Monocytes Relative: 6 % (ref 3–12)
Neutro Abs: 5 10*3/uL (ref 1.7–7.7)
Neutrophils Relative %: 58 % (ref 43–77)
Platelets: 286 10*3/uL (ref 150–400)
RBC: 4.38 MIL/uL (ref 4.22–5.81)
WBC: 8.6 10*3/uL (ref 4.0–10.5)

## 2013-02-02 LAB — GLUCOSE, CAPILLARY

## 2013-02-02 MED ORDER — TRAMADOL HCL 50 MG PO TABS: 50.0000 mg | ORAL_TABLET | Freq: Four times a day (QID) | ORAL | Status: DC | PRN

## 2013-02-02 NOTE — ED Notes (Signed)
Pt c/o waking up at night sweating, nausea and needing to urinate, once he gets up to urinate, pt feels better, denies any problems during the day,

## 2013-02-02 NOTE — ED Notes (Signed)
Pt c/o lower back pain and waking up during night sweating/nauseated and has to urinate. States suprapubic pain when he wakes up but feels better after he urinates. Denies urinary changes or pain. lnbm this am, denies black or bloody stools. Sx's intermittent x "3-4 weeks"

## 2013-02-02 NOTE — ED Notes (Signed)
Dr. Rosario Adie at bedside, pt given warm blanket,

## 2013-02-02 NOTE — Discharge Instructions (Signed)
Workup for the back pain that's intermittent and mostly at night was negative. Recommend you followup with your primary care Dr. for further workup in the next few days. Take the tramadol as needed for pain. Return for any new worse symptoms.

## 2013-02-02 NOTE — ED Notes (Signed)
Dr. Deretha Emory at bedside speaking with pt

## 2013-02-02 NOTE — ED Provider Notes (Signed)
CSN: [id number]     Arrival date & time 02/02/13  4098 History  This chart was scribed for Shelda Jakes, MD, by Yevette Edwards, ED Scribe. This patient was seen in room APA19/APA19 and the patient's care was started at 8:32 AM.  First MD Initiated Contact with Patient 02/02/13 0801     Chief Complaint  Patient presents with  . Nausea  . Back Pain    Patient is a 72 y.o. male presenting with back pain. The history is provided by the patient. No language interpreter was used.  Back Pain Location:  Lumbar spine Quality:  Unable to specify Radiates to:  Does not radiate Pain severity:  Mild Pain is:  Worse during the night Onset quality:  Gradual Duration:  1 month Timing:  Intermittent Progression:  Unchanged Chronicity:  New Context: not falling   Relieved by:  Nothing Worsened by:  Nothing tried Ineffective treatments:  None tried Associated symptoms: no abdominal pain, no chest pain, no dysuria, no fever and no headaches   Risk factors: obesity    HPI Comments: [name] is a 72 y.o. male, with DM and prostate cancer, who presents to the Emergency Department complaining of bilateral lower back pain which is occurs only at night and has been occurring for approximately a month. Associated with the back pain, the pt reports he has also experienced diaphoresis and nausea. He denies dysuria, chest pain, neck pain, or abdominal pain.  The pt also denies any recent injuries to his back.  He reports that he has also had redness to his eyes which he attributes to seasonal allergies. The pt is a former smoker.   Dr. Rodman Pickle is his PCP. His last appointment with Dr. Rodman Pickle was 3-4 months ago.  Past Medical History  Diagnosis Date  . Hypertension   . Diabetes   . Prostate cancer 2010  . Irregular heart beat    Past Surgical History  Procedure Laterality Date  . Colonoscopy  02/07/2012    Procedure: COLONOSCOPY;  Surgeon: West Bali, MD;  Location: [facility];   Service: Endoscopy;  Laterality: N/A;  9:40 am-changed to [address] notified  . Flexible sigmoidoscopy  02/28/2012    Procedure: FLEXIBLE SIGMOIDOSCOPY;  Surgeon: West Bali, MD;  Location: [facility];  Service: Endoscopy;  Laterality: N/A;  12:45   Family History  Problem Relation Age of Onset  . Prostate cancer Father   . Coronary artery disease Mother    History  Substance Use Topics  . Smoking status: Former Smoker -- 0.50 packs/day    Types: Cigarettes    Quit date: 04/08/2005  . Smokeless tobacco: Former Neurosurgeon    Quit date: 04/18/2005  . Alcohol Use: No    Review of Systems  Constitutional: Positive for diaphoresis. Negative for fever.  HENT: Negative for congestion, rhinorrhea and sore throat.   Eyes: Positive for redness.  Respiratory: Positive for shortness of breath (Baseline). Negative for cough.   Cardiovascular: Negative for chest pain and leg swelling.  Gastrointestinal: Positive for nausea. Negative for vomiting, abdominal pain and diarrhea.  Genitourinary: Negative for dysuria.  Musculoskeletal: Positive for back pain. Negative for neck pain.  Skin: Negative for rash.  Allergic/Immunologic: Positive for environmental allergies.  Neurological: Negative for headaches.  Hematological: Does not bruise/bleed easily.    Allergies  Review of patient's allergies indicates no known allergies.  Home Medications   Current Outpatient Rx  Name  Route  Sig  Dispense  Refill  . carvedilol (COREG) 6.25 MG tablet   Oral   Take 6.25 mg by mouth 2 (two) times daily.         . cloNIDine (CATAPRES) 0.1 MG tablet   Oral   Take 0.1 mg by mouth 2 (two) times daily.         . digoxin (LANOXIN) 0.25 MG tablet   Oral   Take 250 mcg by mouth every morning.         . hydrocortisone (ANUSOL-HC) 25 MG suppository   Rectal   Place 1 suppository (25 mg total) rectally every 12 (twelve) hours. For 12 days   24 suppository   0   . lisinopril (PRINIVIL,ZESTRIL)  10 MG tablet   Oral   Take 10 mg by mouth every morning.         . metFORMIN (GLUCOPHAGE) 1000 MG tablet   Oral   Take 1,000 mg by mouth 2 (two) times daily.          Marland Kitchen spironolactone (ALDACTONE) 25 MG tablet   Oral   Take 25 mg by mouth every morning.         . traMADol (ULTRAM) 50 MG tablet   Oral   Take 1 tablet (50 mg total) by mouth every 6 (six) hours as needed.   20 tablet   0    Triage Vitals: BP 161/91  Pulse 95  Temp(Src) 97.8 F (36.6 C) (Oral)  Resp 18  Ht 5' 11.5" (1.816 m)  Wt 235 lb (106.595 kg)  BMI 32.32 kg/m2  SpO2 96%  Physical Exam  Nursing note and vitals reviewed. Constitutional: He is oriented to person, place, and time. He appears well-developed and well-nourished. No distress.  HENT:  Head: Normocephalic and atraumatic.  Moist mucous membranes.   Eyes: EOM are normal. No scleral icterus.  Neck: Neck supple. No tracheal deviation present.  Cardiovascular: Normal rate, regular rhythm and normal heart sounds.   No murmur heard. Pulmonary/Chest: Effort normal and breath sounds normal. No respiratory distress. He has no wheezes. He has no rales.  Abdominal: Bowel sounds are normal. There is no tenderness.  Musculoskeletal: Normal range of motion. He exhibits no edema.  Neurological: He is alert and oriented to person, place, and time.  Skin: Skin is warm and dry.  Psychiatric: He has a normal mood and affect. His behavior is normal.    ED Course  Procedures (including critical care time)  DIAGNOSTIC STUDIES: Oxygen Saturation is 96% on room air, normal by my interpretation.    COORDINATION OF CARE:  8:41 AM- Discussed treatment plan with patient, and the patient agreed to the plan.   Labs Review Labs Reviewed  GLUCOSE, CAPILLARY - Abnormal; Notable for the following:    Glucose-Capillary 116 (*)    All other components within normal limits  BASIC METABOLIC PANEL - Abnormal; Notable for the following:    Glucose, Bld 100 (*)     GFR calc non Af Amer 88 (*)    All other components within normal limits  URINALYSIS, ROUTINE W REFLEX MICROSCOPIC  CBC WITH DIFFERENTIAL   Results for orders placed during the hospital encounter of 02/02/13  URINALYSIS, ROUTINE W REFLEX MICROSCOPIC      Result Value Range   Color, Urine YELLOW  YELLOW   APPearance CLEAR  CLEAR   Specific Gravity, Urine 1.015  1.005 - 1.030   pH 7.0  5.0 - 8.0   Glucose, UA NEGATIVE  NEGATIVE mg/dL   Hgb  urine dipstick NEGATIVE  NEGATIVE   Bilirubin Urine NEGATIVE  NEGATIVE   Ketones, ur NEGATIVE  NEGATIVE mg/dL   Protein, ur NEGATIVE  NEGATIVE mg/dL   Urobilinogen, UA 0.2  0.0 - 1.0 mg/dL   Nitrite NEGATIVE  NEGATIVE   Leukocytes, UA NEGATIVE  NEGATIVE  GLUCOSE, CAPILLARY      Result Value Range   Glucose-Capillary 116 (*) 70 - 99 mg/dL   Comment 1 Notify RN     Comment 2 Documented in Chart    CBC WITH DIFFERENTIAL      Result Value Range   WBC 8.6  4.0 - 10.5 K/uL   RBC 4.38  4.22 - 5.81 MIL/uL   Hemoglobin 13.1  13.0 - 17.0 g/dL   HCT 16.1  09.6 - 04.5 %   MCV 89.7  78.0 - 100.0 fL   MCH 29.9  26.0 - 34.0 pg   MCHC 33.3  30.0 - 36.0 g/dL   RDW 40.9  81.1 - 91.4 %   Platelets 286  150 - 400 K/uL   Neutrophils Relative % 58  43 - 77 %   Neutro Abs 5.0  1.7 - 7.7 K/uL   Lymphocytes Relative 32  12 - 46 %   Lymphs Abs 2.8  0.7 - 4.0 K/uL   Monocytes Relative 6  3 - 12 %   Monocytes Absolute 0.5  0.1 - 1.0 K/uL   Eosinophils Relative 5  0 - 5 %   Eosinophils Absolute 0.4  0.0 - 0.7 K/uL   Basophils Relative 0  0 - 1 %   Basophils Absolute 0.0  0.0 - 0.1 K/uL  BASIC METABOLIC PANEL      Result Value Range   Sodium 139  135 - 145 mEq/L   Potassium 4.8  3.5 - 5.1 mEq/L   Chloride 101  96 - 112 mEq/L   CO2 32  19 - 32 mEq/L   Glucose, Bld 100 (*) 70 - 99 mg/dL   BUN 14  6 - 23 mg/dL   Creatinine, Ser 7.82  0.50 - 1.35 mg/dL   Calcium 9.8  8.4 - 95.6 mg/dL   GFR calc non Af Amer 88 (*) >90 mL/min   GFR calc Af Amer >90  >90 mL/min     Imaging Review Dg Lumbar Spine Complete  02/02/2013   CLINICAL DATA:  Low back pain. No known injury.  EXAM: LUMBAR SPINE - COMPLETE 4+ VIEW  COMPARISON:  12/16/2005 CT  FINDINGS: Early degenerative spurring throughout the lumbar spine. Disc spaces are maintained. Degenerative spurs at degenerative facet disease at L4-5 and L5-S1. Normal alignment. No fracture. SI joints are symmetric and unremarkable.  IMPRESSION: No acute findings.   Electronically Signed   By: Charlett Nose M.D.   On: 02/02/2013 09:15    EKG Interpretation   None       MDM   1. Back pain    Workup for the intermittent back pain mostly at night is negative. X-rays of the lumbar area without sniffing abnormalities. Urinalysis is negative for urinary tract infection. Renal function is normal. Patient will followup with primary care Dr. for further evaluation.  I personally performed the services described in this documentation, which was scribed in my presence. The recorded information has been reviewed and is accurate.     Shelda Jakes, MD 02/02/13 [phone]

## 2014-08-04 DIAGNOSIS — Z6832 Body mass index (BMI) 32.0-32.9, adult: Secondary | ICD-10-CM | POA: Diagnosis not present

## 2014-08-04 DIAGNOSIS — J302 Other seasonal allergic rhinitis: Secondary | ICD-10-CM | POA: Diagnosis not present

## 2014-08-04 DIAGNOSIS — H1013 Acute atopic conjunctivitis, bilateral: Secondary | ICD-10-CM | POA: Diagnosis not present

## 2014-08-04 DIAGNOSIS — I10 Essential (primary) hypertension: Secondary | ICD-10-CM | POA: Diagnosis not present

## 2014-08-04 DIAGNOSIS — E6609 Other obesity due to excess calories: Secondary | ICD-10-CM | POA: Diagnosis not present

## 2014-10-05 DIAGNOSIS — E1129 Type 2 diabetes mellitus with other diabetic kidney complication: Secondary | ICD-10-CM | POA: Diagnosis not present

## 2014-10-05 DIAGNOSIS — E119 Type 2 diabetes mellitus without complications: Secondary | ICD-10-CM | POA: Diagnosis not present

## 2014-10-05 DIAGNOSIS — I10 Essential (primary) hypertension: Secondary | ICD-10-CM | POA: Diagnosis not present

## 2014-10-05 DIAGNOSIS — E6609 Other obesity due to excess calories: Secondary | ICD-10-CM | POA: Diagnosis not present

## 2014-10-05 DIAGNOSIS — Z1389 Encounter for screening for other disorder: Secondary | ICD-10-CM | POA: Diagnosis not present

## 2014-10-05 DIAGNOSIS — Z6832 Body mass index (BMI) 32.0-32.9, adult: Secondary | ICD-10-CM | POA: Diagnosis not present

## 2014-11-28 ENCOUNTER — Encounter (HOSPITAL_COMMUNITY): Payer: Self-pay | Admitting: *Deleted

## 2014-11-28 ENCOUNTER — Emergency Department (HOSPITAL_COMMUNITY): Payer: Commercial Managed Care - HMO

## 2014-11-28 ENCOUNTER — Emergency Department (HOSPITAL_COMMUNITY)
Admission: EM | Admit: 2014-11-28 | Discharge: 2014-11-28 | Disposition: A | Discharge status: Home / Self Care | Payer: Commercial Managed Care - HMO | Attending: Emergency Medicine | Admitting: Emergency Medicine

## 2014-11-28 DIAGNOSIS — Z8679 Personal history of other diseases of the circulatory system: Secondary | ICD-10-CM | POA: Diagnosis not present

## 2014-11-28 DIAGNOSIS — Y9389 Activity, other specified: Secondary | ICD-10-CM | POA: Diagnosis not present

## 2014-11-28 DIAGNOSIS — Z79899 Other long term (current) drug therapy: Secondary | ICD-10-CM | POA: Insufficient documentation

## 2014-11-28 DIAGNOSIS — S46912A Strain of unspecified muscle, fascia and tendon at shoulder and upper arm level, left arm, initial encounter: Secondary | ICD-10-CM | POA: Diagnosis not present

## 2014-11-28 DIAGNOSIS — Y9289 Other specified places as the place of occurrence of the external cause: Secondary | ICD-10-CM | POA: Diagnosis not present

## 2014-11-28 DIAGNOSIS — Y998 Other external cause status: Secondary | ICD-10-CM | POA: Diagnosis not present

## 2014-11-28 DIAGNOSIS — X58XXXA Exposure to other specified factors, initial encounter: Secondary | ICD-10-CM | POA: Diagnosis not present

## 2014-11-28 DIAGNOSIS — Z8546 Personal history of malignant neoplasm of prostate: Secondary | ICD-10-CM | POA: Diagnosis not present

## 2014-11-28 DIAGNOSIS — Z87891 Personal history of nicotine dependence: Secondary | ICD-10-CM | POA: Insufficient documentation

## 2014-11-28 DIAGNOSIS — I10 Essential (primary) hypertension: Secondary | ICD-10-CM | POA: Insufficient documentation

## 2014-11-28 DIAGNOSIS — S4992XA Unspecified injury of left shoulder and upper arm, initial encounter: Secondary | ICD-10-CM | POA: Diagnosis present

## 2014-11-28 DIAGNOSIS — E119 Type 2 diabetes mellitus without complications: Secondary | ICD-10-CM | POA: Diagnosis not present

## 2014-11-28 DIAGNOSIS — M79602 Pain in left arm: Secondary | ICD-10-CM | POA: Diagnosis not present

## 2014-11-28 MED ORDER — NAPROXEN 500 MG PO TABS: 500.0000 mg | ORAL_TABLET | Freq: Two times a day (BID) | ORAL | Status: AC

## 2014-11-28 MED ORDER — TRAMADOL HCL 50 MG PO TABS: 50.0000 mg | ORAL_TABLET | Freq: Four times a day (QID) | ORAL | Status: AC | PRN

## 2014-11-28 NOTE — ED Provider Notes (Signed)
  Face-to-face evaluation   History: He complains of pain in left arm, and neck. Pain started after lifting, some wood, 2 weeks ago.  Physical exam: Alert, elderly man in minimal discomfort. Neck has normal range of motion. Left shoulder has normal range of motion and is without deformity.  Medical screening examination/treatment/procedure(s) were conducted as a shared visit with non-physician practitioner(s) and myself.  I personally evaluated the patient during the encounter  Daleen Bo, MD 11/30/14 [phone]

## 2014-11-28 NOTE — ED Notes (Signed)
Patient reports neck pain that radiates down left arm, reports some heavy lifting recently.

## 2014-11-28 NOTE — Discharge Instructions (Signed)
Muscle Strain A muscle strain (pulled muscle) happens when a muscle is stretched beyond normal length. It happens when a sudden, violent force stretches your muscle too far. Usually, a few of the fibers in your muscle are torn. Muscle strain is common in athletes. Recovery usually takes 1-2 weeks. Complete healing takes 5-6 weeks.  HOME CARE   Follow the PRICE method of treatment to help your injury get better. Do this the first 2-3 days after the injury:  Protect. Protect the muscle to keep it from getting injured again.  Rest. Limit your activity and rest the injured body part.  Ice. Put ice in a plastic bag. Place a towel between your skin and the bag. Then, apply the ice and leave it on from 15-20 minutes each hour. After the third day, switch to moist heat packs.  Compression. Use a splint or elastic bandage on the injured area for comfort. Do not put it on too tightly.  Elevate. Keep the injured body part above the level of your heart.  Only take medicine as told by your doctor.  Warm up before doing exercise to prevent future muscle strains. GET HELP IF:   You have more pain or puffiness (swelling) in the injured area.  You feel numbness, tingling, or notice a loss of strength in the injured area. MAKE SURE YOU:   Understand these instructions.  Will watch your condition.  Will get help right away if you are not doing well or get worse. Document Released: 01/02/2008 Document Revised: 01/13/2013 Document Reviewed: 10/22/2012 [facility] Patient Information 2015 [place], [place]. This information is not intended to replace advice given to you by your health care provider. Make sure you discuss any questions you have with your health care provider.

## 2014-11-28 NOTE — ED Provider Notes (Signed)
CSN: [id number]     Arrival date & time 11/28/14  1238 History  This chart was scribed for non-physician practitioner, Kem Parkinson, PA-C, working with Daleen Bo, MD by Terressa Koyanagi, ED Scribe. This patient was seen in room APFT23/APFT23 and the patient's care was started at 1:17 PM.   Chief Complaint  Patient presents with  . Neck Pain   The history is provided by the patient. No language interpreter was used.   PCP: Leonides Grills, MD HPI Comments: [name] is a 74 y.o. male, with PMHx noted below, who presents to the Emergency Department complaining of atraumatic, sudden onset, sharp, 8/10 shoulder pain radiating to the left arm onset 4 days ago. Pt reports dropping his arm aggravates the pain and turning his neck slightly and raising his arm worsens the pain. Pt reports rubbing OTC muscle rub at home without improvement. Pt denies chest pain, SOB, fever, chills, back pain, shoulder blade pain, dizziness or any other Sx at this time.   Past Medical History  Diagnosis Date  . Hypertension   . Diabetes   . Irregular heart beat   . Prostate cancer 2010   Past Surgical History  Procedure Laterality Date  . Colonoscopy  02/07/2012    Procedure: COLONOSCOPY;  Surgeon: Danie Binder, MD;  Location: [facility];  Service: Endoscopy;  Laterality: N/A;  9:40 am-changed to [place] notified  . Flexible sigmoidoscopy  02/28/2012    Procedure: FLEXIBLE SIGMOIDOSCOPY;  Surgeon: Danie Binder, MD;  [facility]: [facility];  Service: Endoscopy;  Laterality: N/A;  12:45   Family History  Problem Relation Age of Onset  . Prostate cancer Father   . Coronary artery disease Mother    Social History  Substance Use Topics  . Smoking status: Former Smoker -- 0.50 packs/day    Types: Cigarettes    Quit date: 04/08/2005  . Smokeless tobacco: Former Systems developer    Quit date: 04/18/2005  . Alcohol Use: No    Review of Systems  Constitutional: Negative for fever and chills.   Respiratory: Negative for shortness of breath.   Cardiovascular: Negative for chest pain.  Musculoskeletal: Positive for arthralgias (shoulder pain). Negative for back pain and neck pain.  Neurological: Negative for dizziness.  All other systems reviewed and are negative.  Allergies  Review of patient's allergies indicates no known allergies.  Home Medications   Prior to Admission medications   Medication Sig Start Date End Date Taking? Authorizing Provider  carvedilol (COREG) 6.25 MG tablet Take 6.25 mg by mouth 2 (two) times daily.   Yes Historical Provider, MD  cloNIDine (CATAPRES) 0.1 MG tablet Take 0.1 mg by mouth 2 (two) times daily.   Yes Historical Provider, MD  digoxin (LANOXIN) 0.25 MG tablet Take 250 mcg by mouth every morning.   Yes Historical Provider, MD  lisinopril (PRINIVIL,ZESTRIL) 10 MG tablet Take 10 mg by mouth every morning.   Yes Historical Provider, MD  metFORMIN (GLUCOPHAGE) 1000 MG tablet Take 1,000 mg by mouth 2 (two) times daily.    Yes Historical Provider, MD  Olopatadine HCl (PATADAY OP) Place 1 drop into both eyes daily.   Yes Historical Provider, MD  spironolactone (ALDACTONE) 25 MG tablet Take 25 mg by mouth every morning.   Yes Historical Provider, MD   Triage Vitals: BP 188/89 mmHg  Pulse 69  Temp(Src) 97.7 F (36.5 C) (Oral)  Resp 18  Ht 5\' 11"  (1.803 m)  Wt 225 lb (102.059 kg)  BMI  31.39 kg/m2  SpO2 98% Physical Exam  Constitutional: He is oriented to person, place, and time. He appears well-developed and well-nourished.  HENT:  Head: Normocephalic.  Eyes: EOM are normal.  Neck: Normal range of motion.  Soft non-tender   Pulmonary/Chest: Effort normal.  Abdominal: He exhibits no distension.  Musculoskeletal: Normal range of motion. He exhibits tenderness.       Left shoulder: He exhibits tenderness. He exhibits normal range of motion, no bony tenderness, no swelling, no deformity, normal pulse and normal strength.      Neurological: He  is alert and oriented to person, place, and time.  DTRs normal and symmetric. Equal grip strength bilateral with 5/5 strength against resistance in upper and lower extremities. No sensory or motor deficits appreciated.  Skin:  Palpable lipoma present to the LUE.   Psychiatric: He has a normal mood and affect.  Nursing note and vitals reviewed.   ED Course  Procedures (including critical care time)   Dg Shoulder Left  11/28/2014   CLINICAL DATA:  Acute left arm pain after heavy lifting. Initial encounter.  EXAM: LEFT SHOULDER - 2+ VIEW  COMPARISON:  None.  FINDINGS: There is no evidence of fracture or dislocation. There is no evidence of arthropathy or other focal bone abnormality. Soft tissues are unremarkable.  IMPRESSION: Normal left shoulder.   Electronically Signed   By: Marijo Conception, M.D.   On: 11/28/2014 13:44     DIAGNOSTIC STUDIES: Oxygen Saturation is 98% on RA, nl by my interpretation.    COORDINATION OF CARE: 1:25 PM-Discussed treatment plan with pt at bedside and pt agreed to plan.  2:13 PM: Recheck. Discussed imaging results with patient and pt advised to ice the affected area. Pt advised to f/u with PCP if Sx do not improve.  MDM   Final diagnoses:  Shoulder strain, left, initial encounter    Pt is well appearing, non-toxic.  Vitals stable .  Pt has reproducable left shoulder pain .  Sx's likely musculoskeletal.  He agrees to symptomatic tx and close PMD f/u and advised to return here for any worsening sx's.  Pt also seen by Dr. Eulis Foster and care plan discussed.    I personally performed the services described in this documentation, which was scribed in my presence. The recorded information has been reviewed and is accurate.    Kem Parkinson, PA-C 11/29/14 [place], MD 11/30/14 [phone]

## 2015-03-07 DIAGNOSIS — E1169 Type 2 diabetes mellitus with other specified complication: Secondary | ICD-10-CM | POA: Diagnosis not present

## 2015-03-07 DIAGNOSIS — R9431 Abnormal electrocardiogram [ECG] [EKG]: Secondary | ICD-10-CM | POA: Diagnosis not present

## 2015-03-07 DIAGNOSIS — F1021 Alcohol dependence, in remission: Secondary | ICD-10-CM | POA: Diagnosis not present

## 2015-03-07 DIAGNOSIS — Z0000 Encounter for general adult medical examination without abnormal findings: Secondary | ICD-10-CM | POA: Diagnosis not present

## 2015-03-07 DIAGNOSIS — E782 Mixed hyperlipidemia: Secondary | ICD-10-CM | POA: Diagnosis not present

## 2015-03-07 DIAGNOSIS — I509 Heart failure, unspecified: Secondary | ICD-10-CM | POA: Diagnosis not present

## 2015-03-07 DIAGNOSIS — I739 Peripheral vascular disease, unspecified: Secondary | ICD-10-CM | POA: Diagnosis not present

## 2015-03-07 DIAGNOSIS — G3184 Mild cognitive impairment, so stated: Secondary | ICD-10-CM | POA: Diagnosis not present

## 2015-05-15 DIAGNOSIS — E119 Type 2 diabetes mellitus without complications: Secondary | ICD-10-CM | POA: Diagnosis not present

## 2015-05-15 DIAGNOSIS — I10 Essential (primary) hypertension: Secondary | ICD-10-CM | POA: Diagnosis not present

## 2015-05-15 DIAGNOSIS — Z6833 Body mass index (BMI) 33.0-33.9, adult: Secondary | ICD-10-CM | POA: Diagnosis not present

## 2015-05-15 DIAGNOSIS — Z0000 Encounter for general adult medical examination without abnormal findings: Secondary | ICD-10-CM | POA: Diagnosis not present

## 2015-05-15 DIAGNOSIS — Z23 Encounter for immunization: Secondary | ICD-10-CM | POA: Diagnosis not present

## 2015-05-15 DIAGNOSIS — E6609 Other obesity due to excess calories: Secondary | ICD-10-CM | POA: Diagnosis not present

## 2015-05-15 DIAGNOSIS — Z1389 Encounter for screening for other disorder: Secondary | ICD-10-CM | POA: Diagnosis not present

## 2015-05-17 DIAGNOSIS — Z0000 Encounter for general adult medical examination without abnormal findings: Secondary | ICD-10-CM | POA: Diagnosis not present

## 2015-05-17 DIAGNOSIS — E119 Type 2 diabetes mellitus without complications: Secondary | ICD-10-CM | POA: Diagnosis not present

## 2015-05-17 DIAGNOSIS — E781 Pure hyperglyceridemia: Secondary | ICD-10-CM | POA: Diagnosis not present

## 2015-05-21 ENCOUNTER — Emergency Department (HOSPITAL_COMMUNITY): Payer: Commercial Managed Care - HMO

## 2015-05-21 ENCOUNTER — Encounter (HOSPITAL_COMMUNITY): Payer: Self-pay

## 2015-05-21 ENCOUNTER — Emergency Department (HOSPITAL_COMMUNITY)
Admission: EM | Admit: 2015-05-21 | Discharge: 2015-05-21 | Disposition: A | Discharge status: Home / Self Care | Payer: Commercial Managed Care - HMO | Attending: Emergency Medicine | Admitting: Emergency Medicine

## 2015-05-21 DIAGNOSIS — E119 Type 2 diabetes mellitus without complications: Secondary | ICD-10-CM | POA: Diagnosis not present

## 2015-05-21 DIAGNOSIS — I10 Essential (primary) hypertension: Secondary | ICD-10-CM | POA: Diagnosis not present

## 2015-05-21 DIAGNOSIS — Z8546 Personal history of malignant neoplasm of prostate: Secondary | ICD-10-CM | POA: Insufficient documentation

## 2015-05-21 DIAGNOSIS — J441 Chronic obstructive pulmonary disease with (acute) exacerbation: Secondary | ICD-10-CM | POA: Insufficient documentation

## 2015-05-21 DIAGNOSIS — Z87891 Personal history of nicotine dependence: Secondary | ICD-10-CM | POA: Diagnosis not present

## 2015-05-21 DIAGNOSIS — Z7984 Long term (current) use of oral hypoglycemic drugs: Secondary | ICD-10-CM | POA: Diagnosis not present

## 2015-05-21 DIAGNOSIS — Z79899 Other long term (current) drug therapy: Secondary | ICD-10-CM | POA: Insufficient documentation

## 2015-05-21 DIAGNOSIS — R0682 Tachypnea, not elsewhere classified: Secondary | ICD-10-CM | POA: Diagnosis not present

## 2015-05-21 DIAGNOSIS — R0602 Shortness of breath: Secondary | ICD-10-CM | POA: Diagnosis not present

## 2015-05-21 DIAGNOSIS — R062 Wheezing: Secondary | ICD-10-CM | POA: Diagnosis not present

## 2015-05-21 DIAGNOSIS — R0902 Hypoxemia: Secondary | ICD-10-CM | POA: Diagnosis not present

## 2015-05-21 LAB — CBC
HEMATOCRIT: 41.9 % (ref 39.0–52.0)
HEMOGLOBIN: 13.6 g/dL (ref 13.0–17.0)
MCH: 29.2 pg (ref 26.0–34.0)
MCHC: 32.5 g/dL (ref 30.0–36.0)
MCV: 89.9 fL (ref 78.0–100.0)
Platelets: 240 10*3/uL (ref 150–400)
RBC: 4.66 MIL/uL (ref 4.22–5.81)
RDW: 13.4 % (ref 11.5–15.5)
WBC: 7.4 10*3/uL (ref 4.0–10.5)

## 2015-05-21 LAB — BASIC METABOLIC PANEL
ANION GAP: 8 (ref 5–15)
BUN: 10 mg/dL (ref 6–20)
CHLORIDE: 104 mmol/L (ref 101–111)
CO2: 27 mmol/L (ref 22–32)
Calcium: 9.4 mg/dL (ref 8.9–10.3)
Creatinine, Ser: 0.77 mg/dL (ref 0.61–1.24)
GFR calc Af Amer: >60 mL/min (ref >60)
GLUCOSE: 112 mg/dL — AB (ref 65–99)
POTASSIUM: 4.2 mmol/L (ref 3.5–5.1)
SODIUM: 139 mmol/L (ref 135–145)

## 2015-05-21 LAB — I-STAT TROPONIN, ED: Troponin i, poc: 0.01 ng/mL (ref 0.00–0.08)

## 2015-05-21 LAB — BRAIN NATRIURETIC PEPTIDE: B NATRIURETIC PEPTIDE 5: 50.7 pg/mL (ref 0.0–100.0)

## 2015-05-21 MED ORDER — PREDNISONE 20 MG PO TABS: 40.0000 mg | ORAL_TABLET | Freq: Every day | ORAL | Status: AC

## 2015-05-21 MED ORDER — AEROCHAMBER PLUS W/MASK MISC
1.0000 | Freq: Once | Status: AC
  Administered 2015-05-21: 1
  Filled 2015-05-21: qty 1

## 2015-05-21 MED ORDER — ALBUTEROL SULFATE HFA 108 (90 BASE) MCG/ACT IN AERS
2.0000 | INHALATION_SPRAY | RESPIRATORY_TRACT | Status: DC
  Administered 2015-05-21: 2 via RESPIRATORY_TRACT
  Filled 2015-05-21: qty 6.7

## 2015-05-21 NOTE — ED Notes (Signed)
Per EMS, pt complains off SOB that began this morning and has lasted all day. Mild wheezing throughout all fields and slightly diminished in lower fields. Pt was diaphoretic on scene and gasping for breath. Pt was placed on 4L [place] and given 125 solumedrol with 2.5 albuterol nebulizer. Pt also hypertensive and 12 lead showed elevated t-waves in v2, v3, v4, v5. After neb treatment pt feels much better. Pt not sob upon arrival and able to speak in complete sentences. Pt has no complaint of pain

## 2015-05-21 NOTE — ED Provider Notes (Signed)
CSN: [id number]     Arrival date & time 05/21/15  0248 History  By signing my name below, I, [name], attest that this documentation has been prepared under the direction and in the presence of Christopher Iles, MD. Electronically Signed: Randa Maddox, ED Scribe. 05/21/2015. 3:29 AM.     Chief Complaint  Patient presents with  . Shortness of Breath   The history is provided by the patient. No language interpreter was used.   HPI Comments: [name] is a 75 y.o. male who presents to the Emergency Department complaining of gradually worsening SOB onset 2 days prior. Pt reports associated diaphoresis. Pt has received breathing treatment that has provided some relief. Denies fever, CP, abdominal pain, nausea, vomiting, new cough, leg swelling or weight change. Pt reports Hx of COPD. Pt denies tobacco use in the last 10 years.   Past Medical History  Diagnosis Date  . Hypertension   . Diabetes ([place])   . Irregular heart beat   . Prostate cancer ([place]) 2010   Past Surgical History  Procedure Laterality Date  . Colonoscopy  02/07/2012    Procedure: COLONOSCOPY;  Surgeon: Danie Binder, MD;  Location: [facility];  Service: Endoscopy;  Laterality: N/A;  9:40 am-changed to [place] notified  . Flexible sigmoidoscopy  02/28/2012    Procedure: FLEXIBLE SIGMOIDOSCOPY;  Surgeon: Danie Binder, MD;  Location: [facility];  Service: Endoscopy;  Laterality: N/A;  12:45   Family History  Problem Relation Age of Onset  . Prostate cancer Father   . Coronary artery disease Mother    Social History  Substance Use Topics  . Smoking status: Former Smoker -- 0.50 packs/day    Types: Cigarettes    Quit date: 04/08/2005  . Smokeless tobacco: Former Systems developer    Quit date: 04/18/2005  . Alcohol Use: No    Review of Systems 10 Systems reviewed and all are negative for acute change except as noted in the HPI.   Allergies  Review of patient's allergies indicates no known  allergies.  Home Medications   Prior to Admission medications   Medication Sig Start Date End Date Taking? Authorizing Provider  carvedilol (COREG) 6.25 MG tablet Take 6.25 mg by mouth 2 (two) times daily.   Yes Historical Provider, MD  cloNIDine (CATAPRES) 0.1 MG tablet Take 0.1 mg by mouth 2 (two) times daily.   Yes Historical Provider, MD  digoxin (LANOXIN) 0.125 MG tablet Take 125 mcg by mouth daily. 02/27/15  Yes Historical Provider, MD  lisinopril (PRINIVIL,ZESTRIL) 20 MG tablet Take 20 mg by mouth daily. 05/15/15  Yes Historical Provider, MD  metFORMIN (GLUCOPHAGE) 1000 MG tablet Take 1,000 mg by mouth 2 (two) times daily.    Yes Historical Provider, MD  Olopatadine HCl (PATADAY OP) Place 1 drop into both eyes daily.   Yes Historical Provider, MD  spironolactone (ALDACTONE) 25 MG tablet Take 25 mg by mouth every morning.   Yes Historical Provider, MD  naproxen (NAPROSYN) 500 MG tablet Take 1 tablet (500 mg total) by mouth 2 (two) times daily with a meal. Patient not taking: Reported on 05/21/2015 11/28/14   Tammy Triplett, PA-C  predniSONE (DELTASONE) 20 MG tablet Take 2 tablets (40 mg total) by mouth daily. 05/21/15   Christopher Iles, MD  traMADol (ULTRAM) 50 MG tablet Take 1 tablet (50 mg total) by mouth every 6 (six) hours as needed. Patient not taking: Reported on 05/21/2015 11/28/14   Kem Parkinson, PA-C  BP 164/96 mmHg  Pulse 80  Temp(Src) 98.2 F (36.8 C) (Oral)  Resp 20  SpO2 96%   Physical Exam  Constitutional: He is oriented to person, place, and time. He appears well-developed and well-nourished. No distress.  HENT:  Head: Normocephalic and atraumatic.  Moist mucous membranes  Eyes: Conjunctivae are normal. Pupils are equal, round, and reactive to light.  Neck: Neck supple.  Cardiovascular: Normal rate, regular rhythm and normal heart sounds.   No murmur heard. Pulmonary/Chest: Effort normal.  diminished breath sounds bilaterally with end expiatory wheezes   Abdominal: Soft. Bowel sounds are normal. He exhibits no distension. There is no tenderness.  Musculoskeletal: He exhibits no edema.  Neurological: He is alert and oriented to person, place, and time.  Fluent speech  Skin: Skin is warm and dry.  Psychiatric: He has a normal mood and affect. Judgment normal.  Nursing note and vitals reviewed.   ED Course  Procedures (including critical care time) DIAGNOSTIC STUDIES: Oxygen Saturation is 96% on RA, adequate by my interpretation.    COORDINATION OF CARE: 3:29 AM-Discussed treatment plan with pt at bedside and pt agreed to plan.     Labs Review Labs Reviewed  BASIC METABOLIC PANEL - Abnormal; Notable for the following:    Glucose, Bld 112 (*)    All other components within normal limits  CBC  BRAIN NATRIURETIC PEPTIDE  I-STAT TROPOININ, ED    Imaging Review Dg Chest 2 View  05/21/2015  CLINICAL DATA:  Acute onset of shortness of breath. Initial encounter. EXAM: CHEST  2 VIEW COMPARISON:  Chest radiograph performed 12/02/2007 FINDINGS: The lungs are well-aerated and clear. There is no evidence of focal opacification, pleural effusion or pneumothorax. The heart is normal in size; the mediastinal contour is within normal limits. No acute osseous abnormalities are seen. IMPRESSION: No acute cardiopulmonary process seen. Electronically Signed   By: Garald Balding M.D.   On: 05/21/2015 04:22   I have personally reviewed and evaluated these lab results as part of my medical decision-making.   EKG Interpretation   Date/Time:  Sunday May 21 2015 02:53:51 EST Ventricular Rate:  98 PR Interval:  151 QRS Duration: 79 QT Interval:  350 QTC Calculation: 447 R Axis:   10 Text Interpretation:  Sinus tachycardia Multiform ventricular premature  complexes Minimal ST depression, lateral leads occasional PVCs new from  previous Otherwise no significant change Confirmed by LITTLE MD, RACHEL  [phone]) on 05/21/2015 4:00:59 AM      Medications  albuterol (PROVENTIL HFA;VENTOLIN HFA) 108 (90 Base) MCG/ACT inhaler 2 puff (2 puffs Inhalation Given 05/21/15 0400)  aerochamber plus with mask device 1 each (1 each Other Given 05/21/15 [id number])    MDM   Final diagnoses:  COPD exacerbation ([place])   Pt w/ PMH including COPD p/w 2 days of gradually worsening shortness of breath. He was brought in by EMS and had received Solu-Medrol and albuterol in route. On exam, he was awake, alert, mildly diaphoretic but in no acute distress. Vital signs notable for hypertension at 174/119, O2 sat 96% on room air. The patient had normal work of breathing, scattered expiratory wheezes bilaterally. He stated that he had had significant improvement after albuterol treatment. EKG shows occasional PVCs but otherwise unchanged from previous. Labs including BNP and troponin normal. On reexamination after observation, the patient remained well appearing and stated that his breathing was much improved. Provided with albuterol inhaler as well as chamber and instructions on use. Patient denies any chest pain and  given his rapid improvement with COPD treatment, I feel that ACS is unlikely. Provided with prednisone course and reviewed return cautions. Patient voiced understanding and was discharged in satisfactory condition.   I personally performed the services described in this documentation, which was scribed in my presence. The recorded information has been reviewed and is accurate.      Christopher Iles, MD 05/21/15 [phone]

## 2015-05-21 NOTE — ED Notes (Signed)
Pt verbalized understanding of albuterol inhaler and spacer use. Pt stable and NAD. Pt has no further questions. Pt states "I can breathe so much better since I came to the hospital"

## 2015-06-07 DIAGNOSIS — H5200 Hypermetropia, unspecified eye: Secondary | ICD-10-CM | POA: Diagnosis not present

## 2015-06-07 DIAGNOSIS — E109 Type 1 diabetes mellitus without complications: Secondary | ICD-10-CM | POA: Diagnosis not present

## 2015-06-07 DIAGNOSIS — H5210 Myopia, unspecified eye: Secondary | ICD-10-CM | POA: Diagnosis not present

## 2015-06-07 DIAGNOSIS — I10 Essential (primary) hypertension: Secondary | ICD-10-CM | POA: Diagnosis not present

## 2015-06-07 DIAGNOSIS — E1129 Type 2 diabetes mellitus with other diabetic kidney complication: Secondary | ICD-10-CM | POA: Diagnosis not present

## 2015-06-27 DIAGNOSIS — C61 Malignant neoplasm of prostate: Secondary | ICD-10-CM | POA: Diagnosis not present

## 2015-06-27 DIAGNOSIS — I10 Essential (primary) hypertension: Secondary | ICD-10-CM | POA: Diagnosis not present

## 2015-06-27 DIAGNOSIS — Z6833 Body mass index (BMI) 33.0-33.9, adult: Secondary | ICD-10-CM | POA: Diagnosis not present

## 2015-06-27 DIAGNOSIS — E1129 Type 2 diabetes mellitus with other diabetic kidney complication: Secondary | ICD-10-CM | POA: Diagnosis not present

## 2015-06-27 DIAGNOSIS — Z1389 Encounter for screening for other disorder: Secondary | ICD-10-CM | POA: Diagnosis not present

## 2015-07-13 DIAGNOSIS — I4891 Unspecified atrial fibrillation: Secondary | ICD-10-CM | POA: Diagnosis not present

## 2015-07-24 DIAGNOSIS — I4891 Unspecified atrial fibrillation: Secondary | ICD-10-CM | POA: Diagnosis not present

## 2015-07-26 DIAGNOSIS — I4891 Unspecified atrial fibrillation: Secondary | ICD-10-CM | POA: Diagnosis not present

## 2015-08-03 DIAGNOSIS — R0602 Shortness of breath: Secondary | ICD-10-CM | POA: Diagnosis not present

## 2015-08-03 DIAGNOSIS — I358 Other nonrheumatic aortic valve disorders: Secondary | ICD-10-CM | POA: Diagnosis not present

## 2015-08-03 DIAGNOSIS — I517 Cardiomegaly: Secondary | ICD-10-CM | POA: Diagnosis not present

## 2015-08-21 DIAGNOSIS — Z1389 Encounter for screening for other disorder: Secondary | ICD-10-CM | POA: Diagnosis not present

## 2015-08-21 DIAGNOSIS — C61 Malignant neoplasm of prostate: Secondary | ICD-10-CM | POA: Diagnosis not present

## 2016-01-16 DIAGNOSIS — I10 Essential (primary) hypertension: Secondary | ICD-10-CM | POA: Diagnosis not present

## 2016-01-16 DIAGNOSIS — J449 Chronic obstructive pulmonary disease, unspecified: Secondary | ICD-10-CM | POA: Diagnosis not present

## 2016-01-16 DIAGNOSIS — J0181 Other acute recurrent sinusitis: Secondary | ICD-10-CM | POA: Diagnosis not present

## 2016-01-16 DIAGNOSIS — Z6831 Body mass index (BMI) 31.0-31.9, adult: Secondary | ICD-10-CM | POA: Diagnosis not present

## 2016-01-16 DIAGNOSIS — Z1389 Encounter for screening for other disorder: Secondary | ICD-10-CM | POA: Diagnosis not present

## 2016-02-01 DIAGNOSIS — E782 Mixed hyperlipidemia: Secondary | ICD-10-CM | POA: Diagnosis not present

## 2016-02-01 DIAGNOSIS — E119 Type 2 diabetes mellitus without complications: Secondary | ICD-10-CM | POA: Diagnosis not present

## 2016-02-01 DIAGNOSIS — J449 Chronic obstructive pulmonary disease, unspecified: Secondary | ICD-10-CM | POA: Diagnosis not present

## 2016-02-01 DIAGNOSIS — I10 Essential (primary) hypertension: Secondary | ICD-10-CM | POA: Diagnosis not present

## 2016-02-01 DIAGNOSIS — Z6831 Body mass index (BMI) 31.0-31.9, adult: Secondary | ICD-10-CM | POA: Diagnosis not present

## 2016-02-21 DIAGNOSIS — Z23 Encounter for immunization: Secondary | ICD-10-CM | POA: Diagnosis not present

## 2016-05-15 DIAGNOSIS — J449 Chronic obstructive pulmonary disease, unspecified: Secondary | ICD-10-CM | POA: Diagnosis not present

## 2016-05-15 DIAGNOSIS — Z1389 Encounter for screening for other disorder: Secondary | ICD-10-CM | POA: Diagnosis not present

## 2016-05-15 DIAGNOSIS — I2510 Atherosclerotic heart disease of native coronary artery without angina pectoris: Secondary | ICD-10-CM | POA: Diagnosis not present

## 2016-05-15 DIAGNOSIS — D075 Carcinoma in situ of prostate: Secondary | ICD-10-CM | POA: Diagnosis not present

## 2016-05-15 DIAGNOSIS — E1129 Type 2 diabetes mellitus with other diabetic kidney complication: Secondary | ICD-10-CM | POA: Diagnosis not present

## 2016-05-15 DIAGNOSIS — C61 Malignant neoplasm of prostate: Secondary | ICD-10-CM | POA: Diagnosis not present

## 2016-05-15 DIAGNOSIS — E6609 Other obesity due to excess calories: Secondary | ICD-10-CM | POA: Diagnosis not present

## 2016-05-15 DIAGNOSIS — Z0001 Encounter for general adult medical examination with abnormal findings: Secondary | ICD-10-CM | POA: Diagnosis not present

## 2016-05-15 DIAGNOSIS — E119 Type 2 diabetes mellitus without complications: Secondary | ICD-10-CM | POA: Diagnosis not present

## 2016-05-15 DIAGNOSIS — N182 Chronic kidney disease, stage 2 (mild): Secondary | ICD-10-CM | POA: Diagnosis not present

## 2016-05-15 DIAGNOSIS — J302 Other seasonal allergic rhinitis: Secondary | ICD-10-CM | POA: Diagnosis not present

## 2016-05-15 DIAGNOSIS — I4891 Unspecified atrial fibrillation: Secondary | ICD-10-CM | POA: Diagnosis not present

## 2016-05-15 DIAGNOSIS — Z6831 Body mass index (BMI) 31.0-31.9, adult: Secondary | ICD-10-CM | POA: Diagnosis not present

## 2016-07-07 DEATH — deceased

## 2017-09-25 IMAGING — CR DG CHEST 2V
2 series · 2 of 2 positions shown · non-contrast
Comparison: Chest radiograph performed 12/02/2007

CLINICAL DATA: Acute onset of shortness of breath. Initial
encounter.

EXAM:
CHEST  2 VIEW

[chest pa]
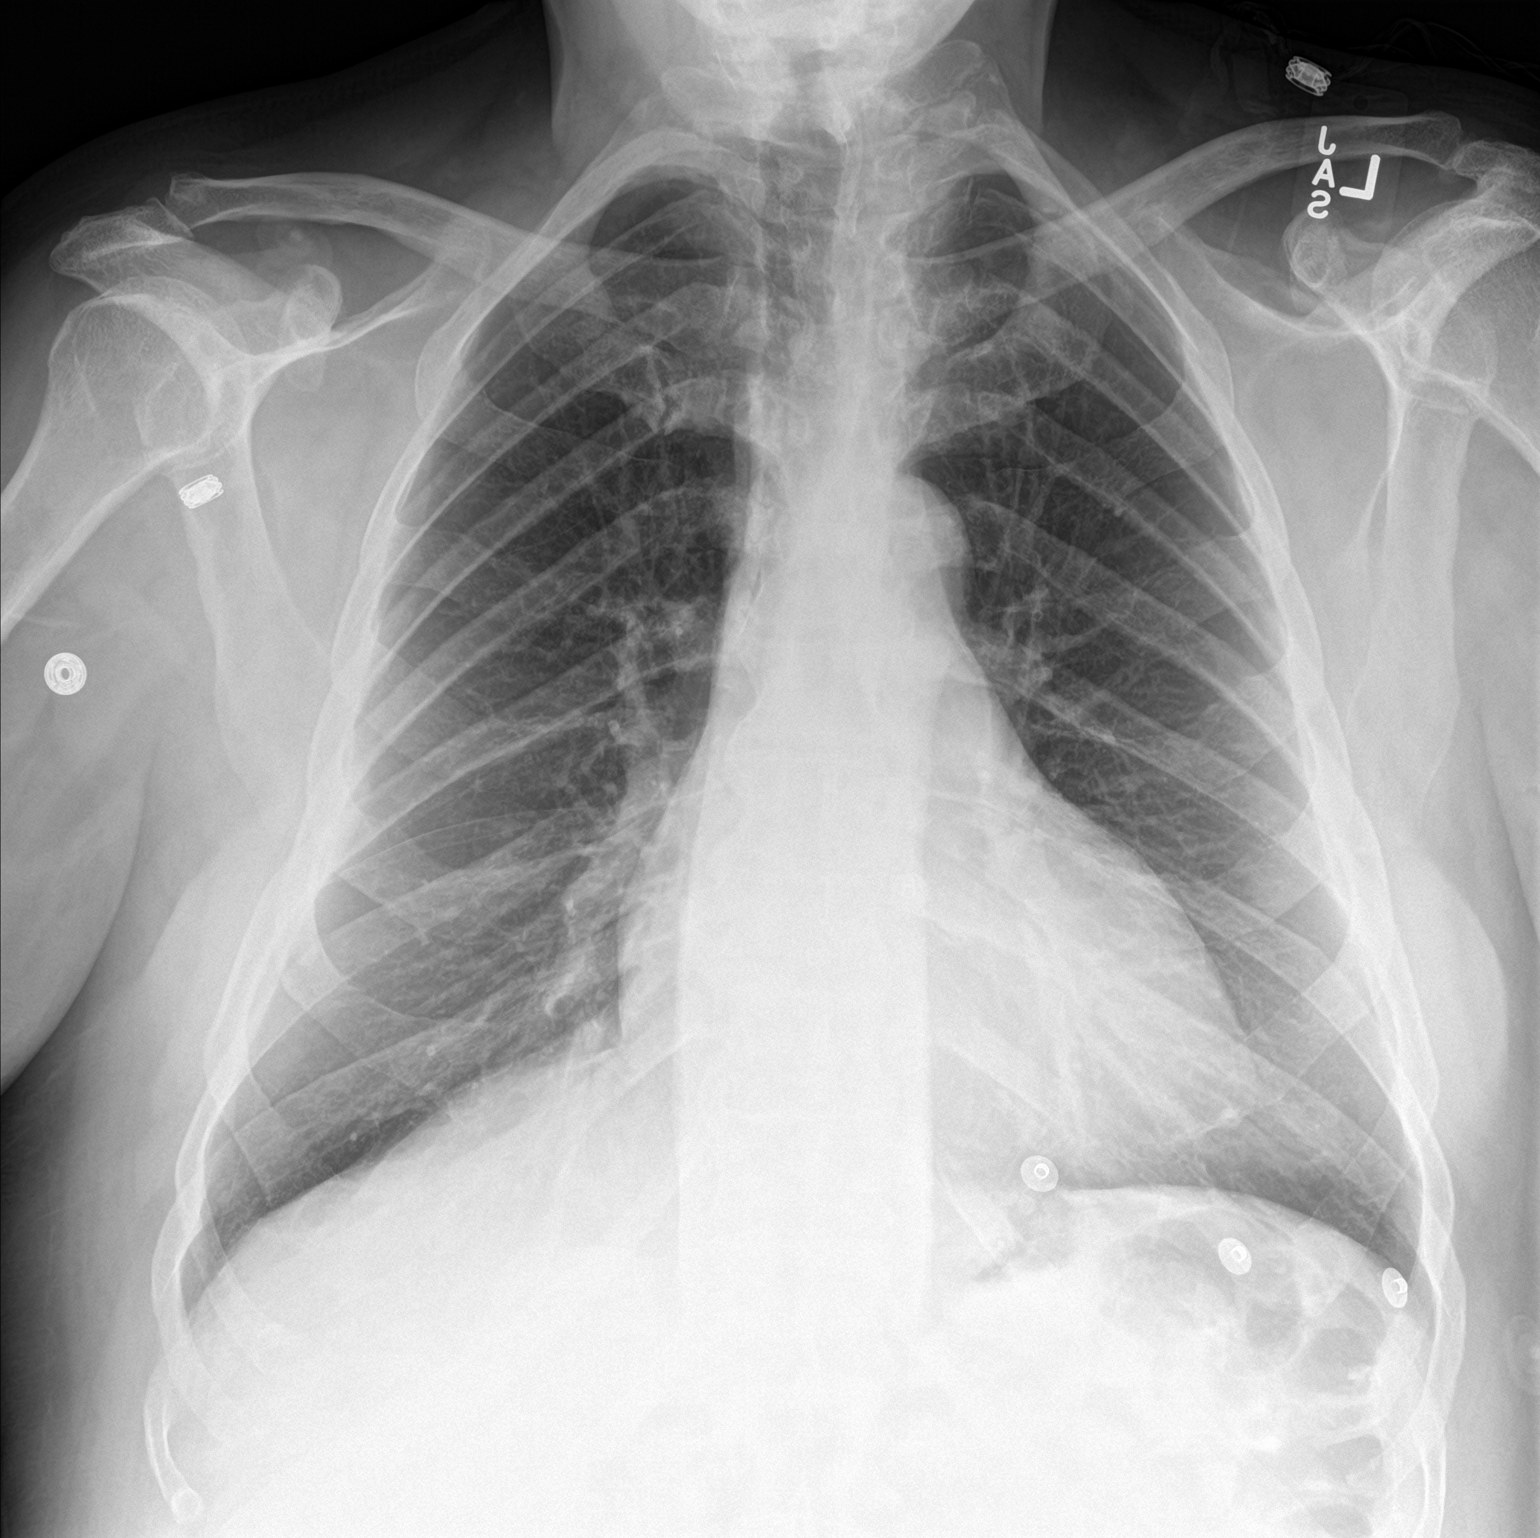

[chest lat]
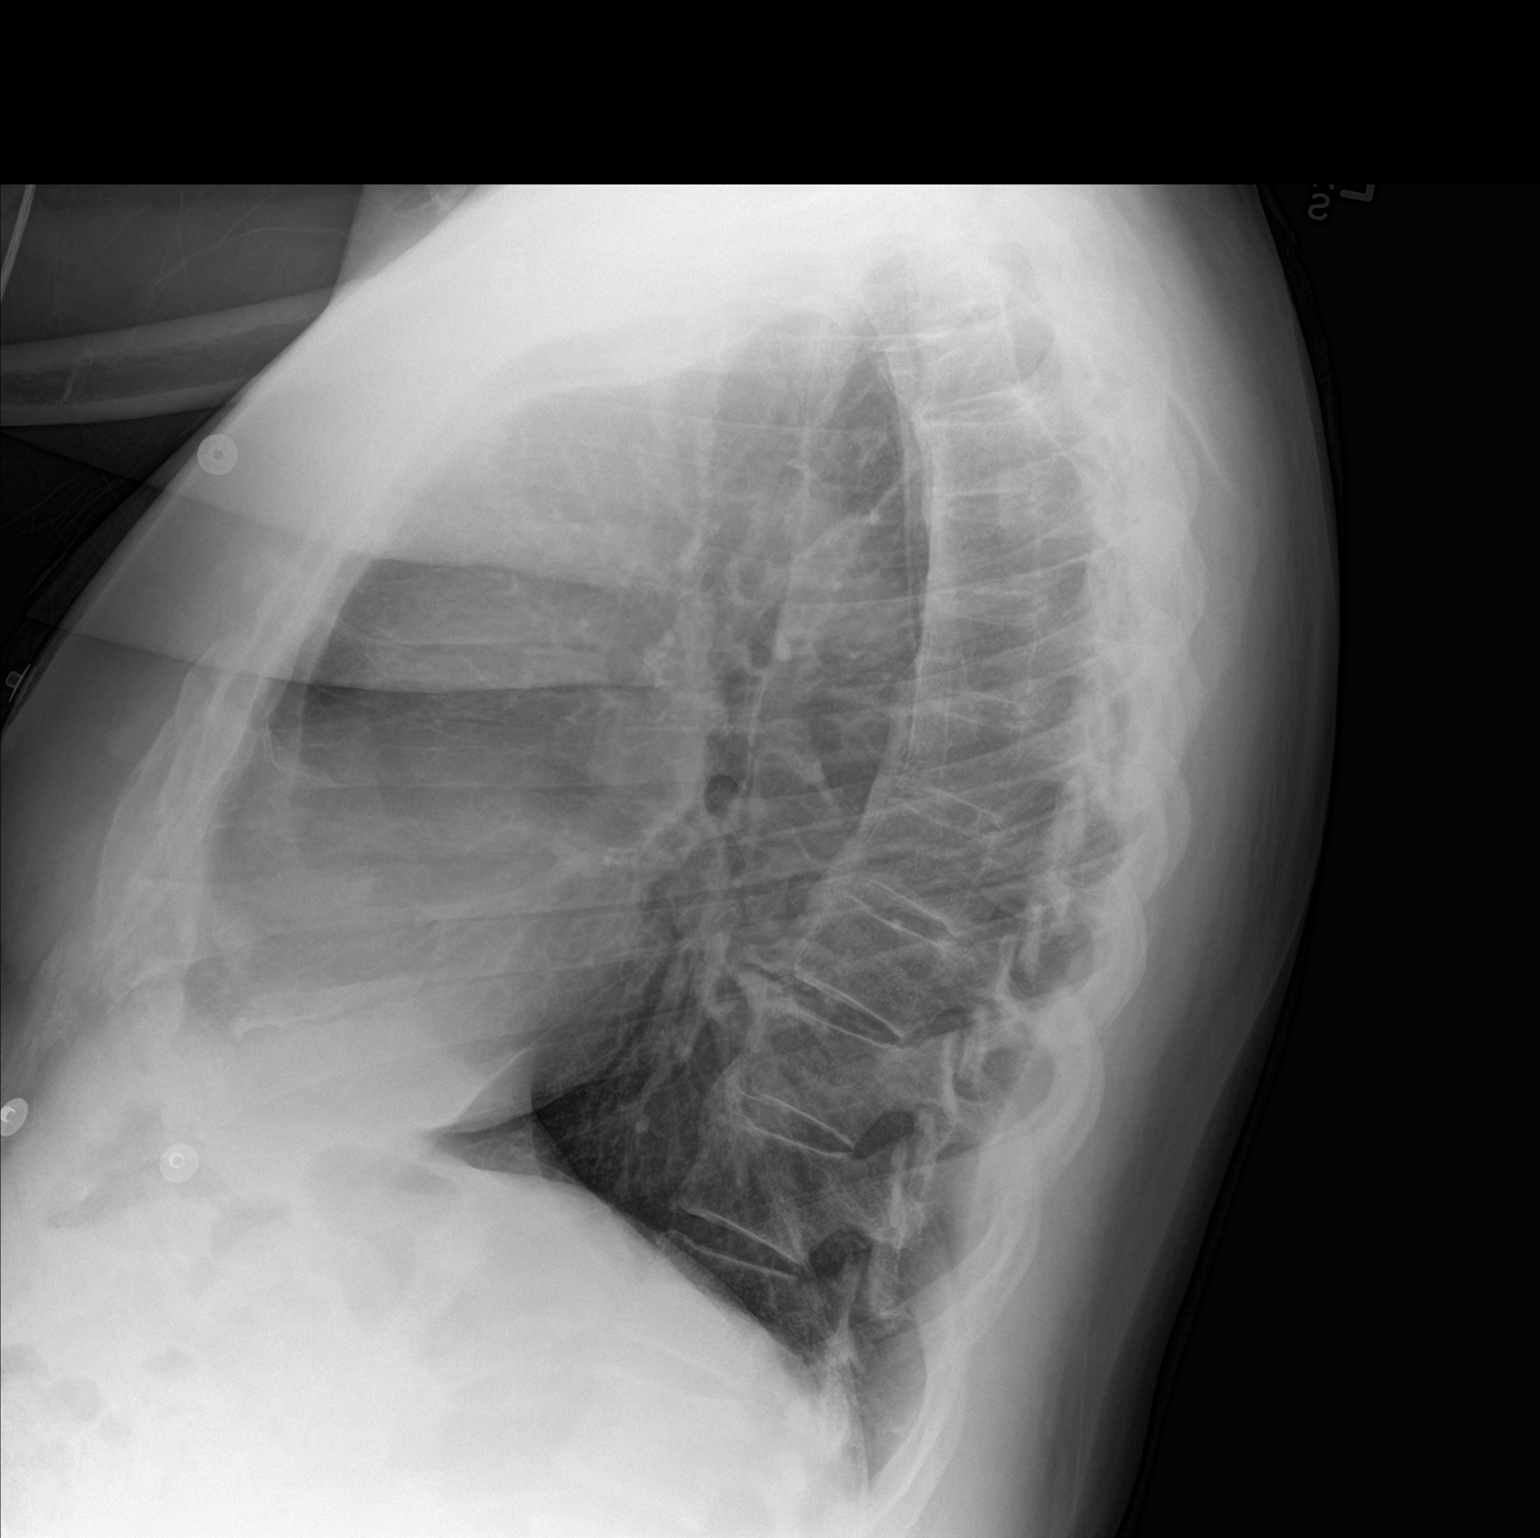

[2 of 2 positions shown; findings below may reference images not displayed]

FINDINGS: The lungs are well-aerated and clear. There is no evidence of focal
opacification, pleural effusion or pneumothorax.

The heart is normal in size; the mediastinal contour is within
normal limits. No acute osseous abnormalities are seen.
IMPRESSION: No acute cardiopulmonary process seen.
